# Patient Record
Sex: Male | Born: 1980 | Race: Black or African American | Hispanic: No | Marital: Married | Smoking: Never smoker
Health system: Southern US, Community
[De-identification: ages and names within clinical notes are randomized; demographics above are authoritative.]

## PROBLEM LIST (undated history)

## (undated) DIAGNOSIS — F419 Anxiety disorder, unspecified: Secondary | ICD-10-CM

## (undated) DIAGNOSIS — K219 Gastro-esophageal reflux disease without esophagitis: Secondary | ICD-10-CM

## (undated) HISTORY — PX: HERNIA REPAIR: SHX51

---

## 2001-11-06 ENCOUNTER — Emergency Department (HOSPITAL_COMMUNITY): Admission: EM | Admit: 2001-11-06 | Discharge: 2001-11-06 | Payer: Self-pay

## 2004-07-27 ENCOUNTER — Emergency Department (HOSPITAL_COMMUNITY): Admission: EM | Admit: 2004-07-27 | Discharge: 2004-07-27 | Payer: Self-pay | Admitting: Emergency Medicine

## 2006-11-13 ENCOUNTER — Emergency Department (HOSPITAL_COMMUNITY): Admission: EM | Admit: 2006-11-13 | Discharge: 2006-11-13 | Payer: Self-pay | Admitting: Emergency Medicine

## 2006-11-27 ENCOUNTER — Emergency Department (HOSPITAL_COMMUNITY): Admission: EM | Admit: 2006-11-27 | Discharge: 2006-11-27 | Payer: Self-pay | Admitting: Emergency Medicine

## 2007-07-17 ENCOUNTER — Emergency Department (HOSPITAL_COMMUNITY): Admission: EM | Admit: 2007-07-17 | Discharge: 2007-07-17 | Payer: Self-pay | Admitting: Emergency Medicine

## 2007-10-31 ENCOUNTER — Emergency Department (HOSPITAL_COMMUNITY): Admission: EM | Admit: 2007-10-31 | Discharge: 2007-10-31 | Payer: Self-pay | Admitting: Emergency Medicine

## 2007-11-12 ENCOUNTER — Emergency Department (HOSPITAL_COMMUNITY): Admission: EM | Admit: 2007-11-12 | Discharge: 2007-11-12 | Payer: Self-pay | Admitting: *Deleted

## 2008-01-04 ENCOUNTER — Emergency Department (HOSPITAL_COMMUNITY): Admission: EM | Admit: 2008-01-04 | Discharge: 2008-01-04 | Payer: Self-pay | Admitting: Emergency Medicine

## 2009-02-05 ENCOUNTER — Emergency Department (HOSPITAL_COMMUNITY): Admission: EM | Admit: 2009-02-05 | Discharge: 2009-02-05 | Payer: Self-pay | Admitting: Emergency Medicine

## 2011-05-12 ENCOUNTER — Emergency Department (HOSPITAL_COMMUNITY)
Admission: EM | Admit: 2011-05-12 | Discharge: 2011-05-12 | Disposition: A | Discharge status: Home / Self Care | Payer: BC Managed Care – PPO | Attending: Emergency Medicine | Admitting: Emergency Medicine

## 2011-05-12 DIAGNOSIS — B029 Zoster without complications: Secondary | ICD-10-CM | POA: Insufficient documentation

## 2011-07-15 LAB — RAPID STREP SCREEN (MED CTR MEBANE ONLY): Streptococcus, Group A Screen (Direct): NEGATIVE

## 2011-07-19 LAB — RAPID STREP SCREEN (MED CTR MEBANE ONLY): Streptococcus, Group A Screen (Direct): NEGATIVE

## 2012-03-02 ENCOUNTER — Encounter (HOSPITAL_COMMUNITY): Payer: Self-pay

## 2012-03-02 ENCOUNTER — Emergency Department (HOSPITAL_COMMUNITY)
Admission: EM | Admit: 2012-03-02 | Discharge: 2012-03-02 | Disposition: A | Discharge status: Home / Self Care | Payer: BC Managed Care – PPO | Attending: Emergency Medicine | Admitting: Emergency Medicine

## 2012-03-02 DIAGNOSIS — R197 Diarrhea, unspecified: Secondary | ICD-10-CM | POA: Insufficient documentation

## 2012-03-02 DIAGNOSIS — K5289 Other specified noninfective gastroenteritis and colitis: Secondary | ICD-10-CM | POA: Insufficient documentation

## 2012-03-02 DIAGNOSIS — E860 Dehydration: Secondary | ICD-10-CM

## 2012-03-02 DIAGNOSIS — K529 Noninfective gastroenteritis and colitis, unspecified: Secondary | ICD-10-CM

## 2012-03-02 LAB — POCT I-STAT, CHEM 8
BUN: 14 mg/dL (ref 6–23)
Glucose, Bld: 84 mg/dL (ref 70–99)
TCO2: 28 mmol/L (ref 0–100)

## 2012-03-02 MED ORDER — ONDANSETRON HCL 4 MG PO TABS: 4.0000 mg | ORAL_TABLET | Freq: Four times a day (QID) | ORAL | Status: AC

## 2012-03-02 MED ORDER — SODIUM CHLORIDE 0.9 % IV BOLUS (SEPSIS)
1000.0000 mL | Freq: Once | INTRAVENOUS | Status: AC
  Administered 2012-03-02: 1000 mL via INTRAVENOUS

## 2012-03-02 MED ORDER — ONDANSETRON HCL 4 MG/2ML IJ SOLN
4.0000 mg | Freq: Once | INTRAMUSCULAR | Status: AC
  Administered 2012-03-02: 4 mg via INTRAVENOUS
  Filled 2012-03-02: qty 2

## 2012-03-02 NOTE — ED Provider Notes (Addendum)
History   This chart was scribed for Forbes Cellar, MD by Melba Coon. The patient was seen in room CDU04/CDU04 and the patient's care was started at 11:31AM.    CSN: [id number]  Arrival date & time 03/02/12  1111   First MD Initiated Contact with Patient 03/02/12 1122      Chief Complaint  Patient presents with  . Diarrhea  . Emesis    (Consider location/radiation/quality/duration/timing/severity/associated sxs/prior treatment) HPI Evan Ibarra is a 31 y.o. male who presents to the Emergency Department complaining of constant, moderate to severe lightheadedness with an onset 2 days ago. Pt also c/o persistent emesis and diarrhea and "has felt funny" since onset. States he had several episodes of NB diarrhea yesterday which is improving today. Feels dehydrated. Pt's daughter was sick this past weekend with similar symptoms of vomiting and diarrhea.. Lightheadedness present; no LOC. Laying back alleviates the symptoms; sitting/standing up aggravates. Chills present 2 days, relieved after ibuprofen. Denies hematuria/dysuria/freq/urgency. No HA, fever, neck pain, sore throat, rash, back pain, CP, SOB, abd pain, dysuria, or extremity pain, edema, weakness, numbness, or tingling. No known allergies. No other pertinent medical symptoms. Unable to tolerate PO   History reviewed. No pertinent past medical history.  Past Surgical History  Procedure Date  . Hernia repair     History reviewed. No pertinent family history.  History  Substance Use Topics  . Smoking status: Never Smoker   . Smokeless tobacco: Not on file  . Alcohol Use: Yes     socially      Review of Systems 10 Systems reviewed and all are negative for acute change except as noted in the HPI.   Allergies  Review of patient's allergies indicates no known allergies.  Home Medications   Current Outpatient Rx  Name Route Sig Dispense Refill  . NAPROXEN SODIUM 220 MG PO TABS Oral Take 440 mg by mouth 2 (two)  times daily as needed. For pain      BP 117/84  Pulse 73  Temp(Src) 97.4 F (36.3 C) (Oral)  Resp 14  SpO2 100%  Physical Exam  Nursing note and vitals reviewed. Constitutional: He is oriented to person, place, and time. He appears well-developed and well-nourished. No distress.  HENT:  Head: Normocephalic and atraumatic.  Mouth/Throat: Mucous membranes are dry. No posterior oropharyngeal edema or posterior oropharyngeal erythema.  Eyes: EOM are normal. Pupils are equal, round, and reactive to light.  Neck: Normal range of motion. Neck supple. No tracheal deviation present.  Cardiovascular: Normal rate, regular rhythm and normal heart sounds.   No murmur heard. Pulmonary/Chest: Effort normal and breath sounds normal. No respiratory distress. He has no wheezes. He has no rales.  Abdominal: Soft. There is no tenderness.       abd benign  Musculoskeletal: Normal range of motion. He exhibits no edema and no tenderness.  Neurological: He is alert and oriented to person, place, and time.  Skin: Skin is warm and dry.  Psychiatric: He has a normal mood and affect. His behavior is normal.    ED Course  Procedures (including critical care time)  COORDINATION OF CARE:  11:35PM - EDMD will order IV fluids and Zofran for the pt.    Labs Reviewed  POCT I-STAT, CHEM 8   No results found.   1. Gastroenteritis   2. Dehydration      MDM  Likely viral syndrome with mild dehydration. Istat chem 8, NS bolus x 2, zofran. Anticipate discharge after hydration. Move to  CDU- will discharge when tolerating PO  I personally performed the services described in this documentation, which was scribed in my presence. The recorded information has been reviewed and considered.   Tolerating PO per nursing staff. Home with zofran odt. No EMC precluding discharge at this time. Given Precautions for return. PMD f/u.        Forbes Cellar, MD 03/02/12 1217  Forbes Cellar, MD 03/02/12  1228  Forbes Cellar, MD 03/02/12 [phone]

## 2012-03-02 NOTE — ED Notes (Signed)
Per EDP, plan to give pt PO challenge, if pt tolerates, will go home. Pt currently drinking ginger ale and eating [place] sandwich

## 2012-03-02 NOTE — Discharge Instructions (Signed)
Dehydration, Adult Dehydration is when you lose more fluids from the body than you take in. Vital organs like the kidneys, brain, and heart cannot function without a proper amount of fluids and salt. Any loss of fluids from the body can cause dehydration.  CAUSES   Vomiting.   Diarrhea.   Excessive sweating.   Excessive urine output.   Fever.  SYMPTOMS  Mild dehydration  Thirst.   Dry lips.   Slightly dry mouth.  Moderate dehydration  Very dry mouth.   Sunken eyes.   Skin does not bounce back quickly when lightly pinched and released.   Dark urine and decreased urine production.   Decreased tear production.   Headache.  Severe dehydration  Very dry mouth.   Extreme thirst.   Rapid, weak pulse (more than 100 beats per minute at rest).   Cold hands and feet.   Not able to sweat in spite of heat and temperature.   Rapid breathing.   Blue lips.   Confusion and lethargy.   Difficulty being awakened.   Minimal urine production.   No tears.  DIAGNOSIS  Your caregiver will diagnose dehydration based on your symptoms and your exam. Blood and urine tests will help confirm the diagnosis. The diagnostic evaluation should also identify the cause of dehydration. TREATMENT  Treatment of mild or moderate dehydration can often be done at home by increasing the amount of fluids that you drink. It is best to drink small amounts of fluid more often. Drinking too much at one time can make vomiting worse. Refer to the home care instructions below. Severe dehydration needs to be treated at the hospital where you will probably be given intravenous (IV) fluids that contain water and electrolytes. HOME CARE INSTRUCTIONS   Ask your caregiver about specific rehydration instructions.   Drink enough fluids to keep your urine clear or pale yellow.   Drink small amounts frequently if you have nausea and vomiting.   Eat as you normally do.   Avoid:   Foods or drinks high in  sugar.   Carbonated drinks.   Juice.   Extremely hot or cold fluids.   Drinks with caffeine.   Fatty, greasy foods.   Alcohol.   Tobacco.   Overeating.   Gelatin desserts.   Wash your hands well to avoid spreading bacteria and viruses.   Only take over-the-counter or prescription medicines for pain, discomfort, or fever as directed by your caregiver.   Ask your caregiver if you should continue all prescribed and over-the-counter medicines.   Keep all follow-up appointments with your caregiver.  SEEK MEDICAL CARE IF:  You have abdominal pain and it increases or stays in one area (localizes).   You have a rash, stiff neck, or severe headache.   You are irritable, sleepy, or difficult to awaken.   You are weak, dizzy, or extremely thirsty.  SEEK IMMEDIATE MEDICAL CARE IF:   You are unable to keep fluids down or you get worse despite treatment.   You have frequent episodes of vomiting or diarrhea.   You have blood or green matter (bile) in your vomit.   You have blood in your stool or your stool looks black and tarry.   You have not urinated in 6 to 8 hours, or you have only urinated a small amount of very dark urine.   You have a fever.   You faint.  MAKE SURE YOU:   Understand these instructions.   Will watch your condition.     Will get help right away if you are not doing well or get worse.  Document Released: 10/11/2005 Document Revised: 09/30/2011 Document Reviewed: 05/31/2011 Exit[employer] Patient Information 2012 [employer]  Viral Gastroenteritis Viral gastroenteritis is also known as stomach flu. This condition affects the stomach and intestinal tract. It can cause sudden diarrhea and vomiting. The illness typically lasts 3 to 8 days. Most people develop an immune response that eventually gets rid of the virus. While this natural response develops, the virus can make you quite ill. CAUSES  Many different viruses can cause gastroenteritis, such as  rotavirus or noroviruses. You can catch one of these viruses by consuming contaminated food or water. You may also catch a virus by sharing utensils or other personal items with an infected person or by touching a contaminated surface. SYMPTOMS  The most common symptoms are diarrhea and vomiting. These problems can cause a severe loss of body fluids (dehydration) and a body salt (electrolyte) imbalance. Other symptoms may include:  Fever.   Headache.   Fatigue.   Abdominal pain.  DIAGNOSIS  Your caregiver can usually diagnose viral gastroenteritis based on your symptoms and a physical exam. A stool sample may also be taken to test for the presence of viruses or other infections. TREATMENT  This illness typically goes away on its own. Treatments are aimed at rehydration. The most serious cases of viral gastroenteritis involve vomiting so severely that you are not able to keep fluids down. In these cases, fluids must be given through an intravenous line (IV). HOME CARE INSTRUCTIONS   Drink enough fluids to keep your urine clear or pale yellow. Drink small amounts of fluids frequently and increase the amounts as tolerated.   Ask your caregiver for specific rehydration instructions.   Avoid:   Foods high in sugar.   Alcohol.   Carbonated drinks.   Tobacco.   Juice.   Caffeine drinks.   Extremely hot or cold fluids.   Fatty, greasy foods.   Too much intake of anything at one time.   Dairy products until 24 to 48 hours after diarrhea stops.   You may consume probiotics. Probiotics are active cultures of beneficial bacteria. They may lessen the amount and number of diarrheal stools in adults. Probiotics can be found in yogurt with active cultures and in supplements.   Wash your hands well to avoid spreading the virus.   Only take over-the-counter or prescription medicines for pain, discomfort, or fever as directed by your caregiver. Do not give aspirin to children.  Antidiarrheal medicines are not recommended.   Ask your caregiver if you should continue to take your regular prescribed and over-the-counter medicines.   Keep all follow-up appointments as directed by your caregiver.  SEEK IMMEDIATE MEDICAL CARE IF:   You are unable to keep fluids down.   You do not urinate at least once every 6 to 8 hours.   You develop shortness of breath.   You notice blood in your stool or vomit. This may look like coffee grounds.   You have abdominal pain that increases or is concentrated in one small area (localized).   You have persistent vomiting or diarrhea.   You have a fever.   The patient is a child younger than 3 months, and he or she has a fever.   The patient is a child older than 3 months, and he or she has a fever and persistent symptoms.   The patient is a child older than 3 months, and   he or she has a fever and symptoms suddenly get worse.   The patient is a baby, and he or she has no tears when crying.  MAKE SURE YOU:   Understand these instructions.   Will watch your condition.   Will get help right away if you are not doing well or get worse.  Document Released: 10/11/2005 Document Revised: 09/30/2011 Document Reviewed: 07/28/2011 Exit[employer] Patient Information 2012 [employer]. 

## 2012-03-02 NOTE — ED Notes (Signed)
Pt VSS. Able to tolerate po well in ED. Will D/c patient.

## 2012-03-02 NOTE — ED Notes (Signed)
Patient states his child had nausea and vomiting recent and patient past two days nausea vomiting and watery stool.  Denies abdominal pain airway intact bilateral equal chest rise and fall.  States intermittent headache improves with antiinflammatory.  Pain currently 2/10 throbbing.  Resting comfortably on stretcher.

## 2012-03-02 NOTE — ED Notes (Signed)
Pt complains of vomiting and headache, since Tuesday after lunch, pt sts that daughter has had a virus.

## 2016-08-30 ENCOUNTER — Emergency Department (HOSPITAL_COMMUNITY)
Admission: EM | Admit: 2016-08-30 | Discharge: 2016-08-30 | Disposition: A | Discharge status: Home / Self Care | Payer: BLUE CROSS/BLUE SHIELD | Attending: Emergency Medicine | Admitting: Emergency Medicine

## 2016-08-30 ENCOUNTER — Encounter (HOSPITAL_COMMUNITY): Payer: Self-pay | Admitting: Emergency Medicine

## 2016-08-30 ENCOUNTER — Emergency Department (HOSPITAL_COMMUNITY): Payer: BLUE CROSS/BLUE SHIELD

## 2016-08-30 DIAGNOSIS — R0789 Other chest pain: Secondary | ICD-10-CM | POA: Diagnosis not present

## 2016-08-30 DIAGNOSIS — R079 Chest pain, unspecified: Secondary | ICD-10-CM | POA: Diagnosis present

## 2016-08-30 LAB — TROPONIN I: Troponin I: <0.03 ng/mL (ref <0.03)

## 2016-08-30 LAB — LIPID PANEL
Cholesterol: 154 mg/dL (ref 0–200)
HDL: 53 mg/dL (ref >40)
LDL CALC: 88 mg/dL (ref 0–99)
Total CHOL/HDL Ratio: 2.9 RATIO
Triglycerides: 64 mg/dL (ref <150)
VLDL: 13 mg/dL (ref 0–40)

## 2016-08-30 LAB — COMPREHENSIVE METABOLIC PANEL
ALBUMIN: 4.5 g/dL (ref 3.5–5.0)
ALK PHOS: 44 U/L (ref 38–126)
ALT: 10 U/L — ABNORMAL LOW (ref 17–63)
AST: 18 U/L (ref 15–41)
Anion gap: 7 (ref 5–15)
BILIRUBIN TOTAL: 0.5 mg/dL (ref 0.3–1.2)
BUN: 13 mg/dL (ref 6–20)
CALCIUM: 9.1 mg/dL (ref 8.9–10.3)
CO2: 29 mmol/L (ref 22–32)
Chloride: 103 mmol/L (ref 101–111)
Creatinine, Ser: 1 mg/dL (ref 0.61–1.24)
GFR calc Af Amer: >60 mL/min (ref >60)
GFR calc non Af Amer: >60 mL/min (ref >60)
GLUCOSE: 123 mg/dL — AB (ref 65–99)
POTASSIUM: 3.6 mmol/L (ref 3.5–5.1)
Sodium: 139 mmol/L (ref 135–145)
TOTAL PROTEIN: 7.4 g/dL (ref 6.5–8.1)

## 2016-08-30 LAB — CBC
HCT: 43.8 % (ref 39.0–52.0)
HEMOGLOBIN: 15 g/dL (ref 13.0–17.0)
MCH: 32 pg (ref 26.0–34.0)
MCHC: 34.2 g/dL (ref 30.0–36.0)
MCV: 93.4 fL (ref 78.0–100.0)
Platelets: 283 10*3/uL (ref 150–400)
RBC: 4.69 MIL/uL (ref 4.22–5.81)
RDW: 13.2 % (ref 11.5–15.5)
WBC: 9 10*3/uL (ref 4.0–10.5)

## 2016-08-30 LAB — DIFFERENTIAL
Basophils Absolute: 0 10*3/uL (ref 0.0–0.1)
Basophils Relative: 0 %
EOS PCT: 4 %
Eosinophils Absolute: 0.3 10*3/uL (ref 0.0–0.7)
LYMPHS ABS: 3.7 10*3/uL (ref 0.7–4.0)
LYMPHS PCT: 41 %
MONO ABS: 0.4 10*3/uL (ref 0.1–1.0)
MONOS PCT: 5 %
Neutro Abs: 4.5 10*3/uL (ref 1.7–7.7)
Neutrophils Relative %: 50 %

## 2016-08-30 LAB — PROTIME-INR
INR: 1.06
Prothrombin Time: 13.9 seconds (ref 11.4–15.2)

## 2016-08-30 LAB — APTT: aPTT: 32 seconds (ref 24–36)

## 2016-08-30 MED ORDER — GI COCKTAIL ~~LOC~~
30.0000 mL | Freq: Once | ORAL | Status: AC
  Administered 2016-08-30: 30 mL via ORAL
  Filled 2016-08-30: qty 30

## 2016-08-30 MED ORDER — HEPARIN SODIUM (PORCINE) 5000 UNIT/ML IJ SOLN: 60.0000 [IU]/kg | Freq: Once | INTRAMUSCULAR | Status: DC

## 2016-08-30 MED ORDER — ASPIRIN 81 MG PO CHEW
324.0000 mg | CHEWABLE_TABLET | Freq: Once | ORAL | Status: AC
  Administered 2016-08-30: 324 mg via ORAL
  Filled 2016-08-30: qty 4

## 2016-08-30 NOTE — ED Triage Notes (Signed)
Chest pain on left side. Started couple weeks ago. Took tums thinking it was indigestion. Non radiating. Coughing makes it more painful. Had cold recently. Denies nausea, diaphoresis. Pain comes and goes and is sharp.

## 2016-08-30 NOTE — ED Notes (Signed)
Pt moved to hallway per charge RN

## 2016-08-30 NOTE — ED Provider Notes (Signed)
Initial ECG reviewed w cardiology.  No Code STEMI indicated   Evan Munchobert Haadi Santellan, MD 08/30/16 1322

## 2016-08-30 NOTE — ED Notes (Signed)
ED Provider at bedside. 

## 2016-08-30 NOTE — ED Provider Notes (Signed)
[facility]-EMERGENCY DEPT Provider Note   CSN: [id number] Arrival date & time: 08/30/16  1303     History   Chief Complaint Chief Complaint  Patient presents with  . Chest Pain    HPI Evan Ibarra is a [age] y.o. male.  Patient presents with sharp lateral chest pain worsened today after eating and notes 2-3 weeks of similar pains that worsen with eating certain foods and improve with antacids. No associated nausea, vomiting, diaphoresis. No dyspnea. No radiation to arm or jaw. No known medical problems. No family history of early cardiac disease.   The history is provided by the patient. No language interpreter was used.  Chest Pain   This is a recurrent problem. The current episode started 1 to 2 hours ago. The problem occurs constantly. The problem has been gradually worsening. The pain is associated with eating. The pain is present in the lateral region. The pain is moderate. The quality of the pain is described as sharp. The pain does not radiate. Exacerbated by: eating. Pertinent negatives include no abdominal pain, no cough, no fever, no hemoptysis, no nausea, no shortness of breath and no vomiting. He has tried antacids for the symptoms. The treatment provided moderate relief. Risk factors include male gender.  Pertinent negatives for past medical history include no CAD, no COPD, no CHF, no diabetes, no hyperlipidemia, no hypertension, no MI and no PE.  His family medical history is significant for diabetes and hypertension.  Pertinent negatives for family medical history include: no heart disease.  Procedure history is negative for cardiac catheterization, stress echo and exercise treadmill test.    History reviewed. No pertinent past medical history.  There are no active problems to display for this patient.   Past Surgical History:  Procedure Laterality Date  . HERNIA REPAIR         Home Medications    Prior to Admission medications   Medication Sig Start Date End Date  Taking? Authorizing Provider  calcium carbonate (TUMS - DOSED IN MG ELEMENTAL CALCIUM) 500 MG chewable tablet Chew 1 tablet by mouth daily as needed for indigestion or heartburn.   Yes Historical Provider, MD  naproxen sodium (ANAPROX) 220 MG tablet Take 440 mg by mouth 2 (two) times daily as needed (for pain).    Yes Historical Provider, MD    Family History History reviewed. No pertinent family history.  Social History Social History  Substance Use Topics  . Smoking status: Never Smoker  . Smokeless tobacco: Not on file  . Alcohol use Yes     Comment: socially     Allergies   Patient has no known allergies.   Review of Systems Review of Systems  Constitutional: Negative for fever.  HENT: Negative.   Respiratory: Negative for cough, hemoptysis and shortness of breath.   Cardiovascular: Positive for chest pain.  Gastrointestinal: Negative for abdominal pain, nausea and vomiting.  Genitourinary: Negative.   Musculoskeletal: Negative.   Skin: Negative.   Allergic/Immunologic: Negative for immunocompromised state.  Neurological: Negative.   Hematological: Does not bruise/bleed easily.  Psychiatric/Behavioral: Negative.      Physical Exam Updated Vital Signs BP 120/81   Pulse 75   Temp 98.1 F (36.7 C) (Oral)   Resp 18   Ht 6\' 3"  (1.905 m)   Wt 108.4 kg   SpO2 99%   BMI 29.87 kg/m   Physical Exam  Constitutional: He is oriented to person, place, and time. He appears well-developed and well-nourished. No distress.  HENT:  Head: Normocephalic and atraumatic.  Eyes: Conjunctivae and EOM are normal. No scleral icterus.  Neck: Normal range of motion. Neck supple.  Cardiovascular: Normal rate, regular rhythm, normal heart sounds and intact distal pulses.  Exam reveals no gallop and no friction rub.   No murmur heard. Pulmonary/Chest: Effort normal and breath sounds normal. No respiratory distress. He has no wheezes. He has no rales.  Abdominal: Soft. He exhibits no  distension. There is no tenderness. There is no rebound and no guarding.  Neurological: He is alert and oriented to person, place, and time.  Skin: Skin is warm and dry. He is not diaphoretic.  Psychiatric: He has a normal mood and affect. His behavior is normal. Judgment and thought content normal.     ED Treatments / Results  Labs (all labs ordered are listed, but only abnormal results are displayed) Labs Reviewed  COMPREHENSIVE METABOLIC PANEL - Abnormal; Notable for the following:       Result Value   Glucose, Bld 123 (*)    ALT 10 (*)    All other components within normal limits  CBC  DIFFERENTIAL  PROTIME-INR  APTT  TROPONIN I  LIPID PANEL  TROPONIN I    EKG  EKG Interpretation  Date/Time:  Monday August 30 2016 13:09:54 EST Ventricular Rate:  72 PR Interval:  216 QRS Duration: 104 QT Interval:  396 QTC Calculation: 433 R Axis:   79 Text Interpretation:  Sinus rhythm with 1st degree A-V block ST-t wave abnormality Abnormal ekg Confirmed by Gerhard MunchLOCKWOOD, ROBERT  MD [phone]([id number]) on 08/30/2016 1:14:32 PM       Radiology No results found.  Procedures Procedures (including critical care time)  Medications Ordered in ED Medications  aspirin chewable tablet 324 mg (324 mg Oral Given 08/30/16 1343)  gi cocktail (Maalox,Lidocaine,Donnatal) (30 mLs Oral Given 08/30/16 1343)     Initial Impression / Assessment and Plan / ED Course  I have reviewed the triage vital signs and the nursing notes.  Pertinent labs & imaging results that were available during my care of the patient were reviewed by me and considered in my medical decision making (see chart for details).  Clinical Course     Patient presents with intermittent chest pain for the last two weeks associated with eating. Came in today because it was worse than it typical. On arrival he had an EKG that initially revealed concerning ST changes with no prior for comparison. No obvious STEMI. Cardiology was called to  discuss EKG who were not concerned and did not feel EKG was consistent with ischemic changes. He is well-appearing on arrival with a normal exam. Initial troponin is negative. Symptoms improved with GI cocktail. [facility] He is PERC and Well's negative with no indication for further workup of VTE. Will obtain a delta troponin at 3 hours along with second EKG. If negative, patient can be discharged with a prescription for a PPI. Care of patient transferred to Dr. Ladona RidgelGaddy at 1500 with a plan to follow up the second troponin and CXR, disposition pending results.  Final Clinical Impressions(s) / ED Diagnoses   Final diagnoses:  Atypical chest pain    New Prescriptions Current Discharge Medication List       Preston FleetingAnna Lagena Strand, MD 08/30/16 1547    Gerhard Munchobert Lockwood, MD 08/31/16 [phone]

## 2016-09-12 ENCOUNTER — Emergency Department (HOSPITAL_COMMUNITY)
Admission: EM | Admit: 2016-09-12 | Discharge: 2016-09-12 | Disposition: A | Discharge status: Home / Self Care | Payer: BLUE CROSS/BLUE SHIELD | Attending: Emergency Medicine | Admitting: Emergency Medicine

## 2016-09-12 ENCOUNTER — Encounter (HOSPITAL_COMMUNITY): Payer: Self-pay

## 2016-09-12 DIAGNOSIS — Z5321 Procedure and treatment not carried out due to patient leaving prior to being seen by health care provider: Secondary | ICD-10-CM | POA: Diagnosis not present

## 2016-09-12 DIAGNOSIS — R079 Chest pain, unspecified: Secondary | ICD-10-CM | POA: Insufficient documentation

## 2016-09-12 HISTORY — DX: Gastro-esophageal reflux disease without esophagitis: K21.9

## 2016-09-12 LAB — CBC
HCT: 44.5 % (ref 39.0–52.0)
Hemoglobin: 15.1 g/dL (ref 13.0–17.0)
MCH: 31.4 pg (ref 26.0–34.0)
MCHC: 33.9 g/dL (ref 30.0–36.0)
MCV: 92.5 fL (ref 78.0–100.0)
PLATELETS: 346 10*3/uL (ref 150–400)
RBC: 4.81 MIL/uL (ref 4.22–5.81)
RDW: 13 % (ref 11.5–15.5)
WBC: 12 10*3/uL — ABNORMAL HIGH (ref 4.0–10.5)

## 2016-09-12 LAB — BASIC METABOLIC PANEL
Anion gap: 9 (ref 5–15)
BUN: 8 mg/dL (ref 6–20)
CALCIUM: 9.3 mg/dL (ref 8.9–10.3)
CHLORIDE: 109 mmol/L (ref 101–111)
CO2: 24 mmol/L (ref 22–32)
CREATININE: 0.92 mg/dL (ref 0.61–1.24)
GFR calc non Af Amer: >60 mL/min (ref >60)
GLUCOSE: 102 mg/dL — AB (ref 65–99)
Potassium: 3.8 mmol/L (ref 3.5–5.1)
Sodium: 142 mmol/L (ref 135–145)

## 2016-09-12 LAB — I-STAT TROPONIN, ED: TROPONIN I, POC: 0.01 ng/mL (ref 0.00–0.08)

## 2016-09-12 NOTE — ED Notes (Signed)
Went to lobby called pt again no response pt taken out as LWBS

## 2016-09-12 NOTE — ED Notes (Signed)
Went to lobby called pt. No response x2

## 2016-09-12 NOTE — ED Triage Notes (Signed)
Patient states that he developed left axilla/chest pain this am while at work. States that he forgot to take his zantac before work today. Seen on 11/6 for same and has felt fine while taking zantac daily until missed dose today, NAD

## 2017-01-19 ENCOUNTER — Emergency Department (HOSPITAL_COMMUNITY)
Admission: EM | Admit: 2017-01-19 | Discharge: 2017-01-19 | Disposition: A | Discharge status: Home / Self Care | Payer: BLUE CROSS/BLUE SHIELD | Attending: Emergency Medicine | Admitting: Emergency Medicine

## 2017-01-19 ENCOUNTER — Encounter (HOSPITAL_COMMUNITY): Payer: Self-pay | Admitting: Emergency Medicine

## 2017-01-19 DIAGNOSIS — H9201 Otalgia, right ear: Secondary | ICD-10-CM | POA: Diagnosis present

## 2017-01-19 DIAGNOSIS — H60391 Other infective otitis externa, right ear: Secondary | ICD-10-CM | POA: Diagnosis not present

## 2017-01-19 MED ORDER — OXYCODONE-ACETAMINOPHEN 5-325 MG PO TABS
1.0000 | ORAL_TABLET | ORAL | Status: DC | PRN
  Administered 2017-01-19: 1 via ORAL

## 2017-01-19 MED ORDER — AMOXICILLIN 500 MG PO CAPS: 500.0000 mg | ORAL_CAPSULE | Freq: Three times a day (TID) | ORAL | 0 refills | Status: DC

## 2017-01-19 MED ORDER — OXYCODONE-ACETAMINOPHEN 5-325 MG PO TABS
1.0000 | ORAL_TABLET | Freq: Once | ORAL | Status: AC
  Administered 2017-01-19: 1 via ORAL
  Filled 2017-01-19: qty 1

## 2017-01-19 MED ORDER — OXYCODONE-ACETAMINOPHEN 5-325 MG PO TABS
ORAL_TABLET | ORAL | Status: AC
  Filled 2017-01-19: qty 1

## 2017-01-19 NOTE — ED Triage Notes (Signed)
Pt presents with worsening RIGHT ear pain; pt [facility] for same and dx with ruptured ear drum and given rx for antibiotics; pt states pain now excruciating

## 2017-01-19 NOTE — Discharge Instructions (Signed)
Continue the antibiotic drops as prescribed. Start taking the antibiotic by mouth as well. Follow-up with the ENT doctor. Return to the ED if you develop new or worsening symptoms.

## 2017-01-19 NOTE — ED Provider Notes (Signed)
[facility]EMERGENCY DEPT Provider Note   CSN: [id number] Arrival date & time: 01/19/17  0349     History   Chief Complaint Chief Complaint  Patient presents with  . Otalgia    RIGHT    HPI Evan Ibarra is a 36 y.o. male.  Patient presents with a one-week history of right ear pain. He was diagnosed with otitis externa and perforated eardrum at [facility] on March 23. He was given antibiotic drops which he has been using. Pain in his right ear became acutely worse tonight while he was watching TV. He denies any bleeding or swelling. Pain is now improved after getting Percocet in triage. Denies any fever, chills, nausea or vomiting no bleeding or drainage from the ear. No sore throat or cough. No chest pain or shortness of breath. He has not seen ENT.    The history is provided by the patient.  Otalgia  Associated symptoms include hearing loss. Pertinent negatives include no ear discharge, no abdominal pain, no vomiting and no cough.    Past Medical History:  Diagnosis Date  . GERD (gastroesophageal reflux disease)     There are no active problems to display for this patient.   Past Surgical History:  Procedure Laterality Date  . HERNIA REPAIR         Home Medications    Prior to Admission medications   Medication Sig Start Date End Date Taking? Authorizing Provider  neomycin-polymyxin-hydrocortisone (CORTISPORIN) 3.5-10000-1 otic suspension Place 4 drops into the right ear 3 (three) times daily. 01/14/17 01/21/17 Yes Historical Provider, MD  amoxicillin (AMOXIL) 500 MG capsule Take 1 capsule (500 mg total) by mouth 3 (three) times daily. 01/19/17   [name], MD    Family History No family history on file.  Social History Social History  Substance Use Topics  . Smoking status: Never Smoker  . Smokeless tobacco: Not on file  . Alcohol use Yes     Comment: socially     Allergies   Patient has no known allergies.   Review of Systems Review of Systems    Constitutional: Negative for activity change and appetite change.  HENT: Positive for ear pain and hearing loss. Negative for ear discharge.   Eyes: Negative for visual disturbance.  Respiratory: Negative for cough, shortness of breath and stridor.   Cardiovascular: Negative for chest pain.  Gastrointestinal: Negative for abdominal pain, nausea and vomiting.  Genitourinary: Negative for dysuria and hematuria.  Musculoskeletal: Negative for arthralgias and myalgias.  Neurological: Negative for dizziness, weakness, light-headedness and numbness.  A complete 10 system review of systems was obtained and all systems are negative except as noted in the HPI and PMH.     Physical Exam Updated Vital Signs BP (!) 129/94 (BP Location: Right Arm)   Pulse 68   Temp 98.1 F (36.7 C) (Oral)   Resp 16   Ht 6\' 4"  (1.93 m)   Wt 220 lb (99.8 kg)   SpO2 97%   BMI 26.78 kg/m   Physical Exam  Constitutional: He is oriented to person, place, and time. He appears well-developed and well-nourished. No distress.  HENT:  Head: Normocephalic and atraumatic.  Mouth/Throat: Oropharynx is clear and moist. No oropharyngeal exudate.  Right external ear is normal. No tragus or mastoid pain. There is debris in the right ear canal which is erythematous. Tympanic membrane appears intact and is mildly erythematous.  Eyes: Conjunctivae and EOM are normal. Pupils are equal, round, and reactive to light.  Neck:  Normal range of motion. Neck supple.  No meningismus.  Cardiovascular: Normal rate, regular rhythm, normal heart sounds and intact distal pulses.   No murmur heard. Pulmonary/Chest: Effort normal and breath sounds normal. No respiratory distress.  Abdominal: Soft. There is no tenderness. There is no rebound and no guarding.  Musculoskeletal: Normal range of motion. He exhibits no edema or tenderness.  Neurological: He is alert and oriented to person, place, and time. No cranial nerve deficit. He exhibits  normal muscle tone. Coordination normal.  No ataxia on finger to nose bilaterally. No pronator drift. 5/5 strength throughout. CN 2-12 intact.Equal grip strength. Sensation intact.   Skin: Skin is warm.  Psychiatric: He has a normal mood and affect. His behavior is normal.  Nursing note and vitals reviewed.    ED Treatments / Results  Labs (all labs ordered are listed, but only abnormal results are displayed) Labs Reviewed - No data to display  EKG  EKG Interpretation None       Radiology No results found.  Procedures Procedures (including critical care time)  Medications Ordered in ED Medications  oxyCODONE-acetaminophen (PERCOCET/ROXICET) 5-325 MG per tablet 1 tablet (1 tablet Oral Given 01/19/17 0400)  oxyCODONE-acetaminophen (PERCOCET/ROXICET) 5-325 MG per tablet (  Canceled Entry 01/19/17 0513)     Initial Impression / Assessment and Plan / ED Course  I have reviewed the triage vital signs and the nursing notes.  Pertinent labs & imaging results that were available during my care of the patient were reviewed by me and considered in my medical decision making (see chart for details).    Patient with otitis externa that appears to be healing slowly. We'll add by mouth antibiotics. Needs to follow-up with ENT as likely would benefit from suctioning of debris from ear canal.  Pain controlled in the ED. Follow-up with ENT. Return precautions discussed. No evidence of mastoiditis. Continue cortisporin.  Add PO antibiotics.   Final Clinical Impressions(s) / ED Diagnoses   Final diagnoses:  Otitis, externa, infective, right    New Prescriptions New Prescriptions   No medications on file     Glynn Octave, MD 01/19/17 [phone]

## 2017-01-19 NOTE — ED Notes (Signed)
Pt wishes to not put a gown on at this time.

## 2017-06-28 ENCOUNTER — Emergency Department (HOSPITAL_COMMUNITY): Payer: BLUE CROSS/BLUE SHIELD

## 2017-06-28 ENCOUNTER — Emergency Department (HOSPITAL_COMMUNITY)
Admission: EM | Admit: 2017-06-28 | Discharge: 2017-06-28 | Disposition: A | Discharge status: Home / Self Care | Payer: BLUE CROSS/BLUE SHIELD | Attending: Emergency Medicine | Admitting: Emergency Medicine

## 2017-06-28 ENCOUNTER — Encounter (HOSPITAL_COMMUNITY): Payer: Self-pay | Admitting: Emergency Medicine

## 2017-06-28 DIAGNOSIS — R0789 Other chest pain: Secondary | ICD-10-CM | POA: Insufficient documentation

## 2017-06-28 DIAGNOSIS — R0602 Shortness of breath: Secondary | ICD-10-CM | POA: Insufficient documentation

## 2017-06-28 LAB — CBC
HEMATOCRIT: 42 % (ref 39.0–52.0)
Hemoglobin: 13.9 g/dL (ref 13.0–17.0)
MCH: 31.2 pg (ref 26.0–34.0)
MCHC: 33.1 g/dL (ref 30.0–36.0)
MCV: 94.4 fL (ref 78.0–100.0)
Platelets: 289 10*3/uL (ref 150–400)
RBC: 4.45 MIL/uL (ref 4.22–5.81)
RDW: 13 % (ref 11.5–15.5)
WBC: 7.3 10*3/uL (ref 4.0–10.5)

## 2017-06-28 LAB — BASIC METABOLIC PANEL
Anion gap: 7 (ref 5–15)
BUN: 13 mg/dL (ref 6–20)
CO2: 25 mmol/L (ref 22–32)
Calcium: 9 mg/dL (ref 8.9–10.3)
Chloride: 109 mmol/L (ref 101–111)
Creatinine, Ser: 0.91 mg/dL (ref 0.61–1.24)
GFR calc Af Amer: >60 mL/min (ref >60)
GLUCOSE: 102 mg/dL — AB (ref 65–99)
POTASSIUM: 3.8 mmol/L (ref 3.5–5.1)
Sodium: 141 mmol/L (ref 135–145)

## 2017-06-28 LAB — I-STAT TROPONIN, ED: Troponin i, poc: 0.01 ng/mL (ref 0.00–0.08)

## 2017-06-28 MED ORDER — ESOMEPRAZOLE MAGNESIUM 40 MG PO CPDR: 40.0000 mg | DELAYED_RELEASE_CAPSULE | Freq: Every day | ORAL | 0 refills | Status: DC

## 2017-06-28 NOTE — Discharge Instructions (Signed)
Take nexium once a day.  Stay well hydrated Avoid fatty/greasy, acidic, or spicy foods. Eat multiple small foods throughout the day. Do not lie down for 60 minutes after you eat, as this can worsen your symptoms.  Follow up with your primary care doctor for further evaluation of your symptoms.  Return to the ER if you develop fevers, chill, persistent chest pain, shortness of breath, or any new or worsening symptoms.

## 2017-06-28 NOTE — ED Provider Notes (Signed)
[facility] Provider Note   CSN: [id number] Arrival date & time: 06/28/17  1324     History   Chief Complaint Chief Complaint  Patient presents with  . Shortness of Breath  . Chest Pain    HPI Evan Ibarra is a 36 y.o. male presenting with intermittent chest pain.  Patient states that since Friday, he's had intermittent L sided chest pain. When he has it, and lasts for a few seconds before it goes away. It resolves spontaneously. Chest pain is brought on by eating greasy foods. Patient states he's had similar symptoms before in the past, several months ago, in which he was diagnosed with indigestion. He was prescribed medicine for heartburn at that time, which relieved his symptoms. He has run out of this medicine, so he has not been taking it recently. When he has chest pain, he denies associated vision changes, shortness of breath, nausea, vomiting, or diaphoresis. He has no history of cardiac problems. He does not have a history of hypertension/DM/hyperlipidemia, he does not smoke, and he does not have a family history of heart attack. He does not have any leg pain or swelling. Pain is not brought on by exertion. Patient has no other medical problems, does not take any medicines on a daily basis.  Patient does not currently have any pain.  HPI  Past Medical History:  Diagnosis Date  . GERD (gastroesophageal reflux disease)     There are no active problems to display for this patient.   Past Surgical History:  Procedure Laterality Date  . HERNIA REPAIR         Home Medications    Prior to Admission medications   Medication Sig Start Date End Date Taking? Authorizing Provider  amoxicillin (AMOXIL) 500 MG capsule Take 1 capsule (500 mg total) by mouth 3 (three) times daily. 01/19/17   Rancour, Jeannett Senior, MD  esomeprazole (NEXIUM) 40 MG capsule Take 1 capsule (40 mg total) by mouth daily. 06/28/17   Jaeanna Mccomber, PA-C    Family History History reviewed. No  pertinent family history.  Social History Social History  Substance Use Topics  . Smoking status: Never Smoker  . Smokeless tobacco: Never Used  . Alcohol use Yes     Comment: socially     Allergies   Patient has no known allergies.   Review of Systems Review of Systems  Constitutional: Negative for chills and fever.  HENT: Negative for congestion and sore throat.   Eyes: Negative for visual disturbance.  Respiratory: Negative for cough, chest tightness and shortness of breath.   Cardiovascular: Positive for chest pain. Negative for palpitations and leg swelling.  Gastrointestinal: Negative for abdominal distention, abdominal pain, constipation, diarrhea, nausea and vomiting.  Genitourinary: Negative for dysuria, frequency and hematuria.  Musculoskeletal: Negative for back pain and neck pain.  Skin: Negative for wound.  Allergic/Immunologic: Negative for immunocompromised state.  Neurological: Negative for dizziness, light-headedness and headaches.  Hematological: Does not bruise/bleed easily.     Physical Exam Updated Vital Signs BP (!) 139/91 (BP Location: Right Arm)   Pulse 72   Temp 98.8 F (37.1 C) (Oral)   Resp 16   Ht 6\' 3"  (1.905 m)   Wt 108.9 kg (240 lb)   SpO2 99%   BMI 30.00 kg/m   Physical Exam  Constitutional: He is oriented to person, place, and time. He appears well-developed and well-nourished. No distress.  HENT:  Head: Normocephalic and atraumatic.  Eyes: Pupils are equal, round, and reactive  to light. EOM are normal.  Neck: Normal range of motion. Neck supple.  Cardiovascular: Normal rate, regular rhythm and intact distal pulses.   Pulmonary/Chest: Effort normal and breath sounds normal. No respiratory distress. He has no decreased breath sounds. He has no wheezes. He has no rhonchi. He has no rales. He exhibits no tenderness.  Abdominal: Soft. Bowel sounds are normal. He exhibits no distension and no mass. There is no tenderness. There is no  rigidity, no rebound, no guarding, no CVA tenderness, no tenderness at McBurney's point and negative Murphy's sign.  Musculoskeletal: Normal range of motion.  Lymphadenopathy:    He has no cervical adenopathy.  Neurological: He is alert and oriented to person, place, and time.  Skin: Skin is warm and dry.  Psychiatric: He has a normal mood and affect.  Nursing note and vitals reviewed.    ED Treatments / Results  Labs (all labs ordered are listed, but only abnormal results are displayed) Labs Reviewed  BASIC METABOLIC PANEL - Abnormal; Notable for the following:       Result Value   Glucose, Bld 102 (*)    All other components within normal limits  CBC  I-STAT TROPONIN, ED    EKG  EKG Interpretation  Date/Time:  Tuesday June 28 2017 13:29:17 EDT Ventricular Rate:  69 PR Interval:  210 QRS Duration: 100 QT Interval:  388 QTC Calculation: 415 R Axis:   89 Text Interpretation:  Sinus rhythm with 1st degree A-V block Abnormal ECG eKG similar to August 30, 2016 Confirmed by Ranae Palms  MD, DAVID ([id number]) on 06/28/2017 1:33:18 PM Also confirmed by Ranae Palms  MD, DAVID ([id number]), editor Barbette Hair [phone])  on 06/28/2017 2:16:50 PM       Radiology Dg Chest 2 View  Result Date: 06/28/2017 CLINICAL DATA:  36 year old male complaining of left-sided chest pain, nausea and shortness of breath for the past 3 days. EXAM: CHEST  2 VIEW COMPARISON:  Chest x-ray 08/30/2016. FINDINGS: Lung volumes are normal. No consolidative airspace disease. No pleural effusions. No pneumothorax. No pulmonary nodule or mass noted. Pulmonary vasculature and the cardiomediastinal silhouette are within normal limits. IMPRESSION: No radiographic evidence of acute cardiopulmonary disease. Electronically Signed   By: Trudie Reed M.D.   On: 06/28/2017 13:58    Procedures Procedures (including critical care time)  Medications Ordered in ED Medications - No data to display   Initial Impression /  Assessment and Plan / ED Course  I have reviewed the triage vital signs and the nursing notes.  Pertinent labs & imaging results that were available during my care of the patient were reviewed by me and considered in my medical decision making (see chart for details).     Patient presenting with intermittent chest pain which lasts for several seconds before resolving spontaneously. This is brought on by eating, especially greasy foods. Patient has history of indigestion in which the symptoms have been relieved by medicine for indigestion. He does not currently have any pain. Physical exam reassuring, as patient is not tachycardic, and heart and lung exam benign. Initial troponin negative, BMP and CMP reassuring. Chest x-ray negative, and EKG unchanged from prior.  Discussed findings with patient. Patient states he is ready to leave, as he has to pick his kids up from school. Discussed option of repeat troponin, but patient states since everything is normal so far, he does not want to wait. Patient with a heart score of 0. No calf pain or swelling, doubt PE. Symptoms consistent  with GERD. Will start patient on Nexium, and instructions to follow-up with primary care. At this time, patient appears safe for discharge. Strict return precautions given. Patient states he understands and agrees to plan.  Final Clinical Impressions(s) / ED Diagnoses   Final diagnoses:  Atypical chest pain    New Prescriptions Discharge Medication List as of 06/28/2017  5:16 PM    START taking these medications   Details  esomeprazole (NEXIUM) 40 MG capsule Take 1 capsule (40 mg total) by mouth daily., Starting Tue 06/28/2017, Print         Violaaccavale, Elleanor Guyett, PA-C 06/29/17 [id number]    Eber HongMiller, Brian, MD 06/29/17 [phone]

## 2017-06-28 NOTE — ED Triage Notes (Signed)
Pt states he started having CP for the past few days and some SOB. No N/v. Denies diaphoresis.

## 2017-10-01 ENCOUNTER — Encounter (HOSPITAL_COMMUNITY): Payer: Self-pay

## 2017-10-01 ENCOUNTER — Other Ambulatory Visit: Payer: Self-pay

## 2017-10-01 ENCOUNTER — Emergency Department (HOSPITAL_COMMUNITY)
Admission: EM | Admit: 2017-10-01 | Discharge: 2017-10-01 | Disposition: A | Discharge status: Home / Self Care | Payer: BLUE CROSS/BLUE SHIELD | Attending: Emergency Medicine | Admitting: Emergency Medicine

## 2017-10-01 DIAGNOSIS — Z79899 Other long term (current) drug therapy: Secondary | ICD-10-CM | POA: Insufficient documentation

## 2017-10-01 DIAGNOSIS — B349 Viral infection, unspecified: Secondary | ICD-10-CM | POA: Diagnosis not present

## 2017-10-01 DIAGNOSIS — R05 Cough: Secondary | ICD-10-CM | POA: Diagnosis present

## 2017-10-01 MED ORDER — FLUTICASONE PROPIONATE 50 MCG/ACT NA SUSP: 1.0000 | Freq: Every day | NASAL | 2 refills | Status: DC

## 2017-10-01 MED ORDER — BENZONATATE 100 MG PO CAPS: 100.0000 mg | ORAL_CAPSULE | Freq: Three times a day (TID) | ORAL | 0 refills | Status: DC | PRN

## 2017-10-01 NOTE — ED Notes (Signed)
Declined W/C at D/C and was escorted to lobby by RN. 

## 2017-10-01 NOTE — Discharge Instructions (Signed)
You were seen in the emergency today and diagnosed with a viral illness.  I have prescribed you multiple medications to treat your symptoms.   -Flonase to be used 1 spray in each nostril daily.  This medication is used to treat your congestion.  -Tessalon can be taken once every 8 hours as needed.  This medication is used to treat your cough.  You will need to follow-up with your primary care provider in 1 week if your symptoms have not improved.  If you do not have a primary care provider one is provided in your discharge instructions.  Return to the emergency department for any new or worsening symptoms including but not limited to persistent fever for 5 days, difficulty breathing, chest pain, or passing out.

## 2017-10-01 NOTE — ED Provider Notes (Signed)
[facility] Provider Note   CSN: [id number] Arrival date & time: 10/01/17  1451     History   Chief Complaint Chief Complaint  Patient presents with  . URI    HPI Evan Ibarra is a 36 y.o. male presents the emergency department complaining of congestion  that started yesterday.  Patient states that symptoms started with congestion and rhinorrhea as well as sneezing.  Has noticed infrequent coughing, nonproductive.  States he felt subjectively warm, he did not take a temperature.  Has tried NyQuil without significant relief.  No other alleviating or aggravating factors.  Denies eye discharge, eye pain, ear pain, sore throat, difficulty breathing, or chest pain.  HPI  Past Medical History:  Diagnosis Date  . GERD (gastroesophageal reflux disease)     There are no active problems to display for this patient.   Past Surgical History:  Procedure Laterality Date  . HERNIA REPAIR         Home Medications    Prior to Admission medications   Medication Sig Start Date End Date Taking? Authorizing Provider  amoxicillin (AMOXIL) 500 MG capsule Take 1 capsule (500 mg total) by mouth 3 (three) times daily. 01/19/17   Rancour, Jeannett SeniorStephen, MD  benzonatate (TESSALON) 100 MG capsule Take 1 capsule (100 mg total) by mouth 3 (three) times daily as needed for cough. 10/01/17   , Pleas KochSamantha R, PA-C  esomeprazole (NEXIUM) 40 MG capsule Take 1 capsule (40 mg total) by mouth daily. 06/28/17   Caccavale, Sophia, PA-C  fluticasone (FLONASE) 50 MCG/ACT nasal spray Place 1 spray into both nostrils daily. 10/01/17   , Pleas KochSamantha R, PA-C    Family History History reviewed. No pertinent family history.  Social History Social History   Tobacco Use  . Smoking status: Never Smoker  . Smokeless tobacco: Never Used  Substance Use Topics  . Alcohol use: No    Frequency: Never  . Drug use: No     Allergies   Patient has no known  allergies.   Review of Systems Review of Systems  Constitutional: Positive for fever (subjective). Negative for chills.  HENT: Positive for congestion and rhinorrhea. Negative for ear pain, sinus pressure, sinus pain and sore throat.   Eyes: Negative for pain, discharge, redness and itching.  Respiratory: Positive for cough. Negative for chest tightness and shortness of breath.   Cardiovascular: Negative for chest pain and palpitations.  Gastrointestinal: Negative for abdominal pain, diarrhea, nausea and vomiting.  Neurological: Negative for dizziness, weakness and numbness.     Physical Exam Updated Vital Signs BP (!) 134/92 (BP Location: Right Arm)   Pulse 84   Temp 99.1 F (37.3 C) (Oral)   Resp 16   Ht 6\' 2"  (1.88 m)   Wt 113.4 kg (250 lb)   SpO2 100%   BMI 32.10 kg/m   Physical Exam  Constitutional: He appears well-developed and well-nourished.  Non-toxic appearance. No distress.  HENT:  Head: Normocephalic and atraumatic.  Right Ear: Tympanic membrane is not perforated, not erythematous, not retracted and not bulging.  Left Ear: Tympanic membrane is not perforated, not erythematous, not retracted and not bulging.  Nose: Right sinus exhibits no maxillary sinus tenderness and no frontal sinus tenderness. Left sinus exhibits no maxillary sinus tenderness and no frontal sinus tenderness.  Mouth/Throat: Uvula is midline and oropharynx is clear and moist. No oropharyngeal exudate or posterior oropharyngeal erythema.  Nasal congestion present.   Eyes: Conjunctivae and EOM are normal. Pupils  are equal, round, and reactive to light. Right eye exhibits no discharge. Left eye exhibits no discharge.  Neck: Normal range of motion. Neck supple.  Cardiovascular: Normal rate and regular rhythm.  No murmur heard. Pulmonary/Chest: Breath sounds normal. No respiratory distress. He has no wheezes. He has no rales.  Abdominal: Soft. He exhibits no distension. There is no tenderness.   Lymphadenopathy:    He has no cervical adenopathy.  Neurological: He is alert.  Clear speech.   Skin: Skin is warm and dry. No rash noted.  Psychiatric: He has a normal mood and affect. His behavior is normal.  Nursing note and vitals reviewed.   ED Treatments / Results  Labs (all labs ordered are listed, but only abnormal results are displayed) Labs Reviewed - No data to display  EKG  EKG Interpretation None       Radiology No results found.  Procedures Procedures (including critical care time)  Medications Ordered in ED Medications - No data to display   Initial Impression / Assessment and Plan / ED Course  I have reviewed the triage vital signs and the nursing notes.  Pertinent labs & imaging results that were available during my care of the patient were reviewed by me and considered in my medical decision making (see chart for details).    Patient presents with symptoms consistent with viral illness. He is nontoxic appearing with stable vital signs. Patient is afebrile and without adventitious sounds on lung exam, doubt PNA. No wheezing on exam. Afebrile, no sinus tenderness, doubt sinusitis. No sore throat, Centor score 0, doubt strep pharyngitis. Suspect viral etiology, will treat supportively with Flonase, and Tessalon. I discussed results, treatment plan, need for PCP follow-up, and return precautions with the patient. Provided opportunity for questions, patient confirmed understanding and is in agreement with plan.    Final Clinical Impressions(s) / ED Diagnoses   Final diagnoses:  Viral illness    ED Discharge Orders        Ordered    fluticasone (FLONASE) 50 MCG/ACT nasal spray  Daily     10/01/17 1607    benzonatate (TESSALON) 100 MG capsule  3 times daily PRN     10/01/17 1607       , [place]antha R, PA-C 10/01/17 1652    Vanetta MuldersZackowski, Scott, MD 10/02/17 1636

## 2017-12-16 ENCOUNTER — Encounter (HOSPITAL_COMMUNITY): Payer: Self-pay | Admitting: *Deleted

## 2017-12-16 ENCOUNTER — Emergency Department (HOSPITAL_COMMUNITY)
Admission: EM | Admit: 2017-12-16 | Discharge: 2017-12-16 | Disposition: A | Discharge status: Home / Self Care | Payer: BLUE CROSS/BLUE SHIELD | Attending: Emergency Medicine | Admitting: Emergency Medicine

## 2017-12-16 ENCOUNTER — Other Ambulatory Visit: Payer: Self-pay

## 2017-12-16 DIAGNOSIS — Z79899 Other long term (current) drug therapy: Secondary | ICD-10-CM | POA: Diagnosis not present

## 2017-12-16 DIAGNOSIS — J3489 Other specified disorders of nose and nasal sinuses: Secondary | ICD-10-CM | POA: Diagnosis present

## 2017-12-16 DIAGNOSIS — J302 Other seasonal allergic rhinitis: Secondary | ICD-10-CM

## 2017-12-16 DIAGNOSIS — R067 Sneezing: Secondary | ICD-10-CM | POA: Diagnosis not present

## 2017-12-16 MED ORDER — CETIRIZINE HCL 10 MG PO TABS: 10.0000 mg | ORAL_TABLET | Freq: Every day | ORAL | 2 refills | Status: DC

## 2017-12-16 MED ORDER — FLUTICASONE PROPIONATE 50 MCG/ACT NA SUSP: 1.0000 | Freq: Every day | NASAL | 2 refills | Status: DC

## 2017-12-16 NOTE — ED Provider Notes (Signed)
[facility] Provider Note   CSN: [id number] Arrival date & time: 12/16/17  1209     History   Chief Complaint Chief Complaint  Patient presents with  . URI    HPI Evan Ibarra is a 37 y.o. male.  HPI   Evan Ibarra is a 37 year old male with a history of GERD and seasonal allergies who presents to the emergency department for evaluation of sneezing and runny nose. Patient states his symptoms began this morning. Believes it to be related to the recent warm weather yesterday.  States that he has had allergies in the past which improved with Zyrtec and Flonase, has not taken these medications for his current episode of sneezing and runny nose.  Denies fevers, chills, ear pain, eye pain, eye itching, sore throat, myalgias, cough, chest pain, shortness of breath.  Denies sick contacts with similar.  Past Medical History:  Diagnosis Date  . GERD (gastroesophageal reflux disease)     There are no active problems to display for this patient.   Past Surgical History:  Procedure Laterality Date  . HERNIA REPAIR         Home Medications    Prior to Admission medications   Medication Sig Start Date End Date Taking? Authorizing Provider  amoxicillin (AMOXIL) 500 MG capsule Take 1 capsule (500 mg total) by mouth 3 (three) times daily. 01/19/17   Rancour, Jeannett SeniorStephen, MD  benzonatate (TESSALON) 100 MG capsule Take 1 capsule (100 mg total) by mouth 3 (three) times daily as needed for cough. 10/01/17   Petrucelli, Pleas KochSamantha R, PA-C  esomeprazole (NEXIUM) 40 MG capsule Take 1 capsule (40 mg total) by mouth daily. 06/28/17   Caccavale, Sophia, PA-C  fluticasone (FLONASE) 50 MCG/ACT nasal spray Place 1 spray into both nostrils daily. 10/01/17   Petrucelli, Pleas KochSamantha R, PA-C    Family History History reviewed. No pertinent family history.  Social History Social History   Tobacco Use  . Smoking status: Never Smoker  . Smokeless tobacco: Never Used    Substance Use Topics  . Alcohol use: No    Frequency: Never  . Drug use: No     Allergies   Patient has no known allergies.   Review of Systems Review of Systems  Constitutional: Negative for chills and fever.  HENT: Positive for rhinorrhea and sneezing. Negative for ear pain, sinus pressure and sore throat.   Eyes: Negative for pain, itching and visual disturbance.  Respiratory: Negative for shortness of breath.   Cardiovascular: Negative for chest pain.  Gastrointestinal: Negative for abdominal pain, nausea and vomiting.  Skin: Negative for rash.  Neurological: Negative for headaches.     Physical Exam Updated Vital Signs BP (!) 131/96 (BP Location: Right Arm)   Pulse 76   Temp 98.5 F (36.9 C) (Oral)   Resp 18   SpO2 100%   Physical Exam  Constitutional: He is oriented to person, place, and time. He appears well-developed and well-nourished. No distress.  HENT:  Head: Normocephalic and atraumatic.  Nasal turbinates pale.  Clear rhinorrhea in bilateral nares.  No tenderness over the maxillary or frontal sinuses.  Mucous membranes moist.  Posterior oropharynx without erythema.  No tonsillar edema or exudate.  Uvula midline.  Eyes: Conjunctivae are normal. Pupils are equal, round, and reactive to light. Right eye exhibits no discharge. Left eye exhibits no discharge.  Neck: Normal range of motion. Neck supple.  Cardiovascular: Normal rate, regular rhythm and intact distal pulses. Exam reveals no  friction rub.  No murmur heard. Pulmonary/Chest: Effort normal and breath sounds normal. No stridor. No respiratory distress. He has no wheezes. He has no rales.  Lymphadenopathy:    He has no cervical adenopathy.  Neurological: He is alert and oriented to person, place, and time. Coordination normal.  Skin: Skin is warm and dry. He is not diaphoretic.  Psychiatric: He has a normal mood and affect. His behavior is normal.  Nursing note and vitals reviewed.    ED  Treatments / Results  Labs (all labs ordered are listed, but only abnormal results are displayed) Labs Reviewed - No data to display  EKG  EKG Interpretation None       Radiology No results found.  Procedures Procedures (including critical care time)  Medications Ordered in ED Medications - No data to display   Initial Impression / Assessment and Plan / ED Course  I have reviewed the triage vital signs and the nursing notes.  Pertinent labs & imaging results that were available during my care of the patient were reviewed by me and considered in my medical decision making (see chart for details).    Presentation consistent with allergic rhinitis.  Will treat with Zyrtec and Flonase.  Have counseled patient to follow-up with his primary care provider should his symptoms persist.  Patient agrees and voiced understanding of plan.  Final Clinical Impressions(s) / ED Diagnoses   Final diagnoses:  Seasonal allergic rhinitis, unspecified trigger    ED Discharge Orders        Ordered    cetirizine (ZYRTEC ALLERGY) 10 MG tablet  Daily     12/16/17 1423    fluticasone (FLONASE) 50 MCG/ACT nasal spray  Daily     12/16/17 1423       Lawrence Marseilles 12/16/17 1426    Donnetta Hutching, MD 12/17/17 [phone]

## 2017-12-16 NOTE — ED Triage Notes (Signed)
Pt reports nasal congestion since this am. No other complaints. No distress noted.

## 2017-12-16 NOTE — Discharge Instructions (Signed)
Please take Zyrtec and Flonase daily for your allergies.

## 2017-12-16 NOTE — ED Notes (Signed)
Declined W/C at D/C and was escorted to lobby by RN. 

## 2017-12-22 IMAGING — DX DG CHEST 2V
2 series · 2 of 2 positions shown · non-contrast
Comparison: 01/04/2008

CLINICAL DATA: Left-sided chest pain

EXAM:
CHEST  2 VIEW

[chest pa]
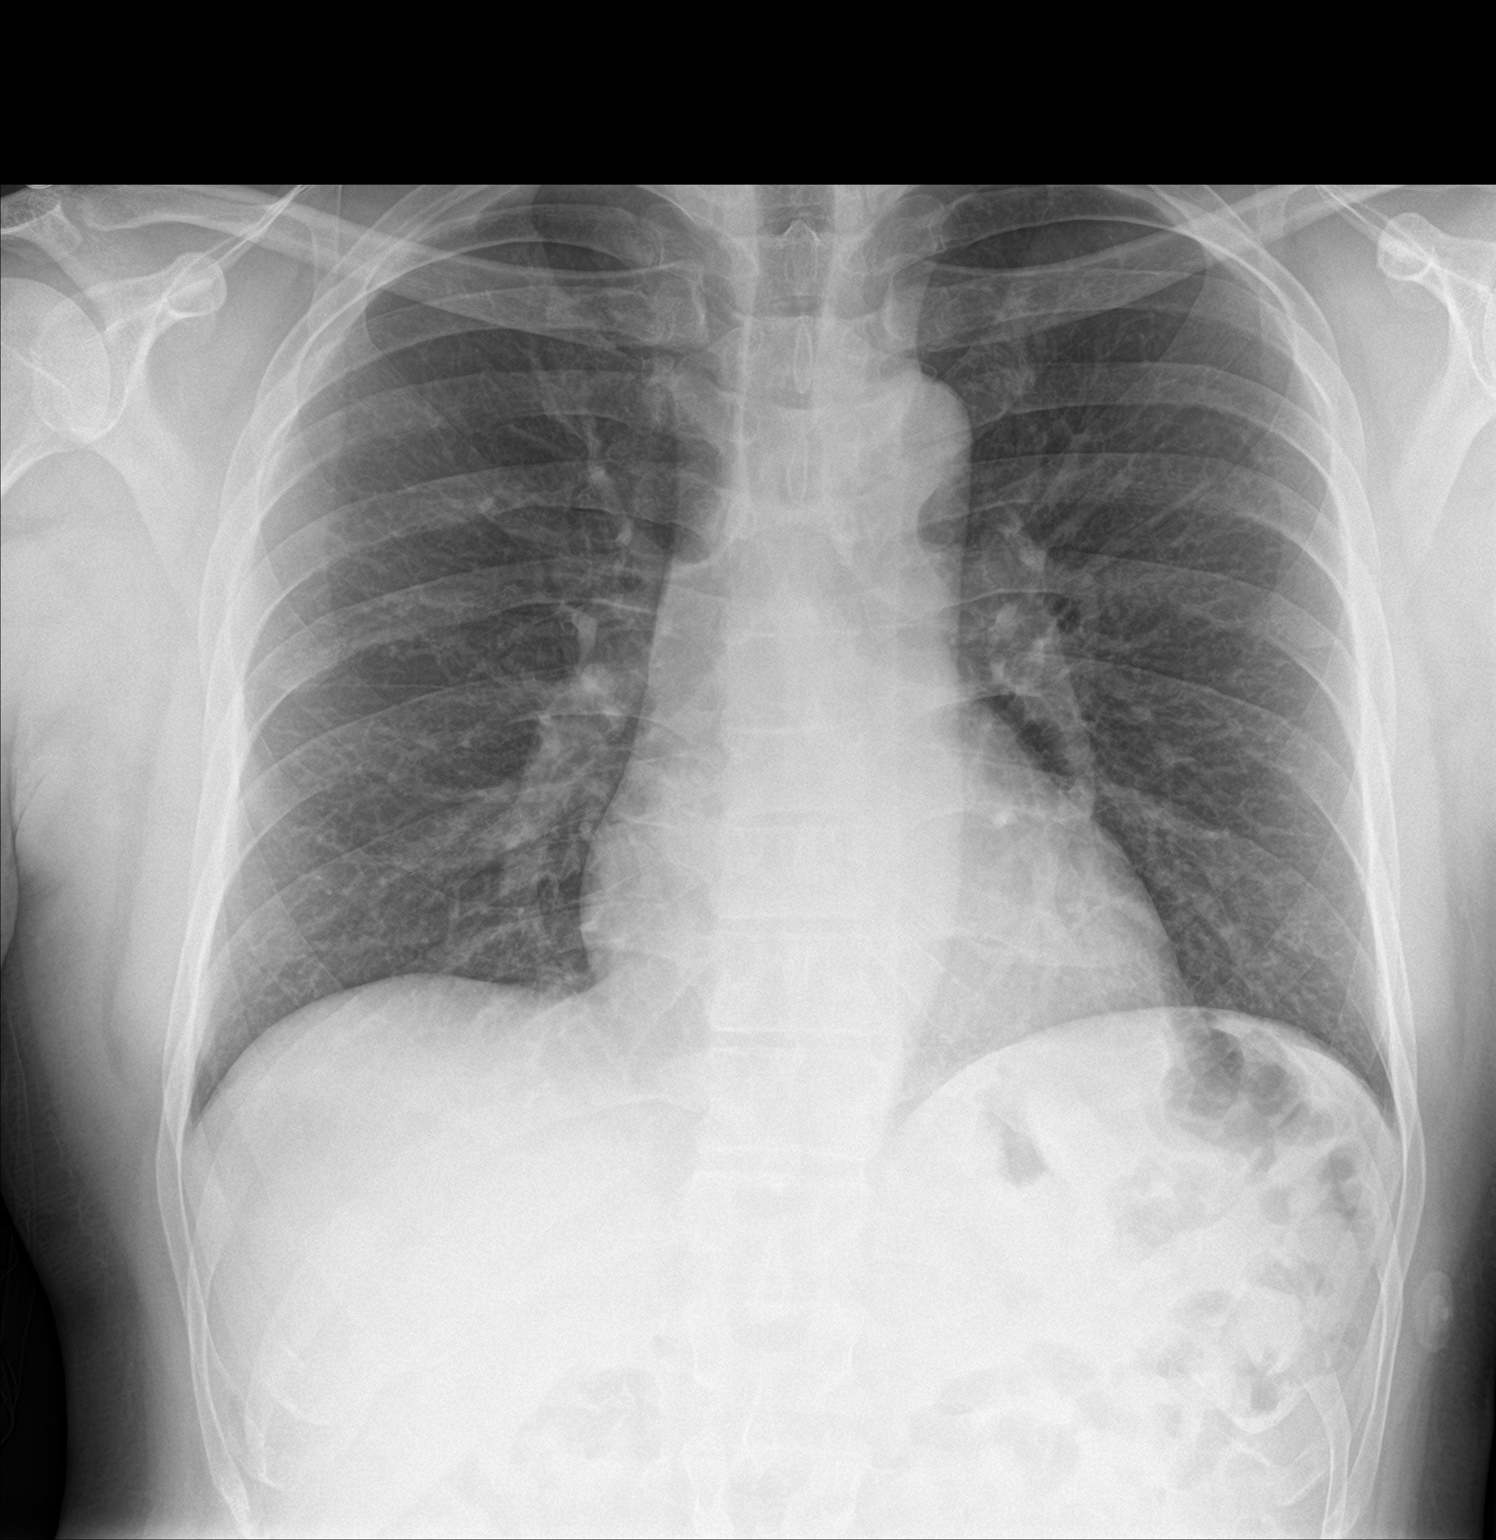

[chest lat]
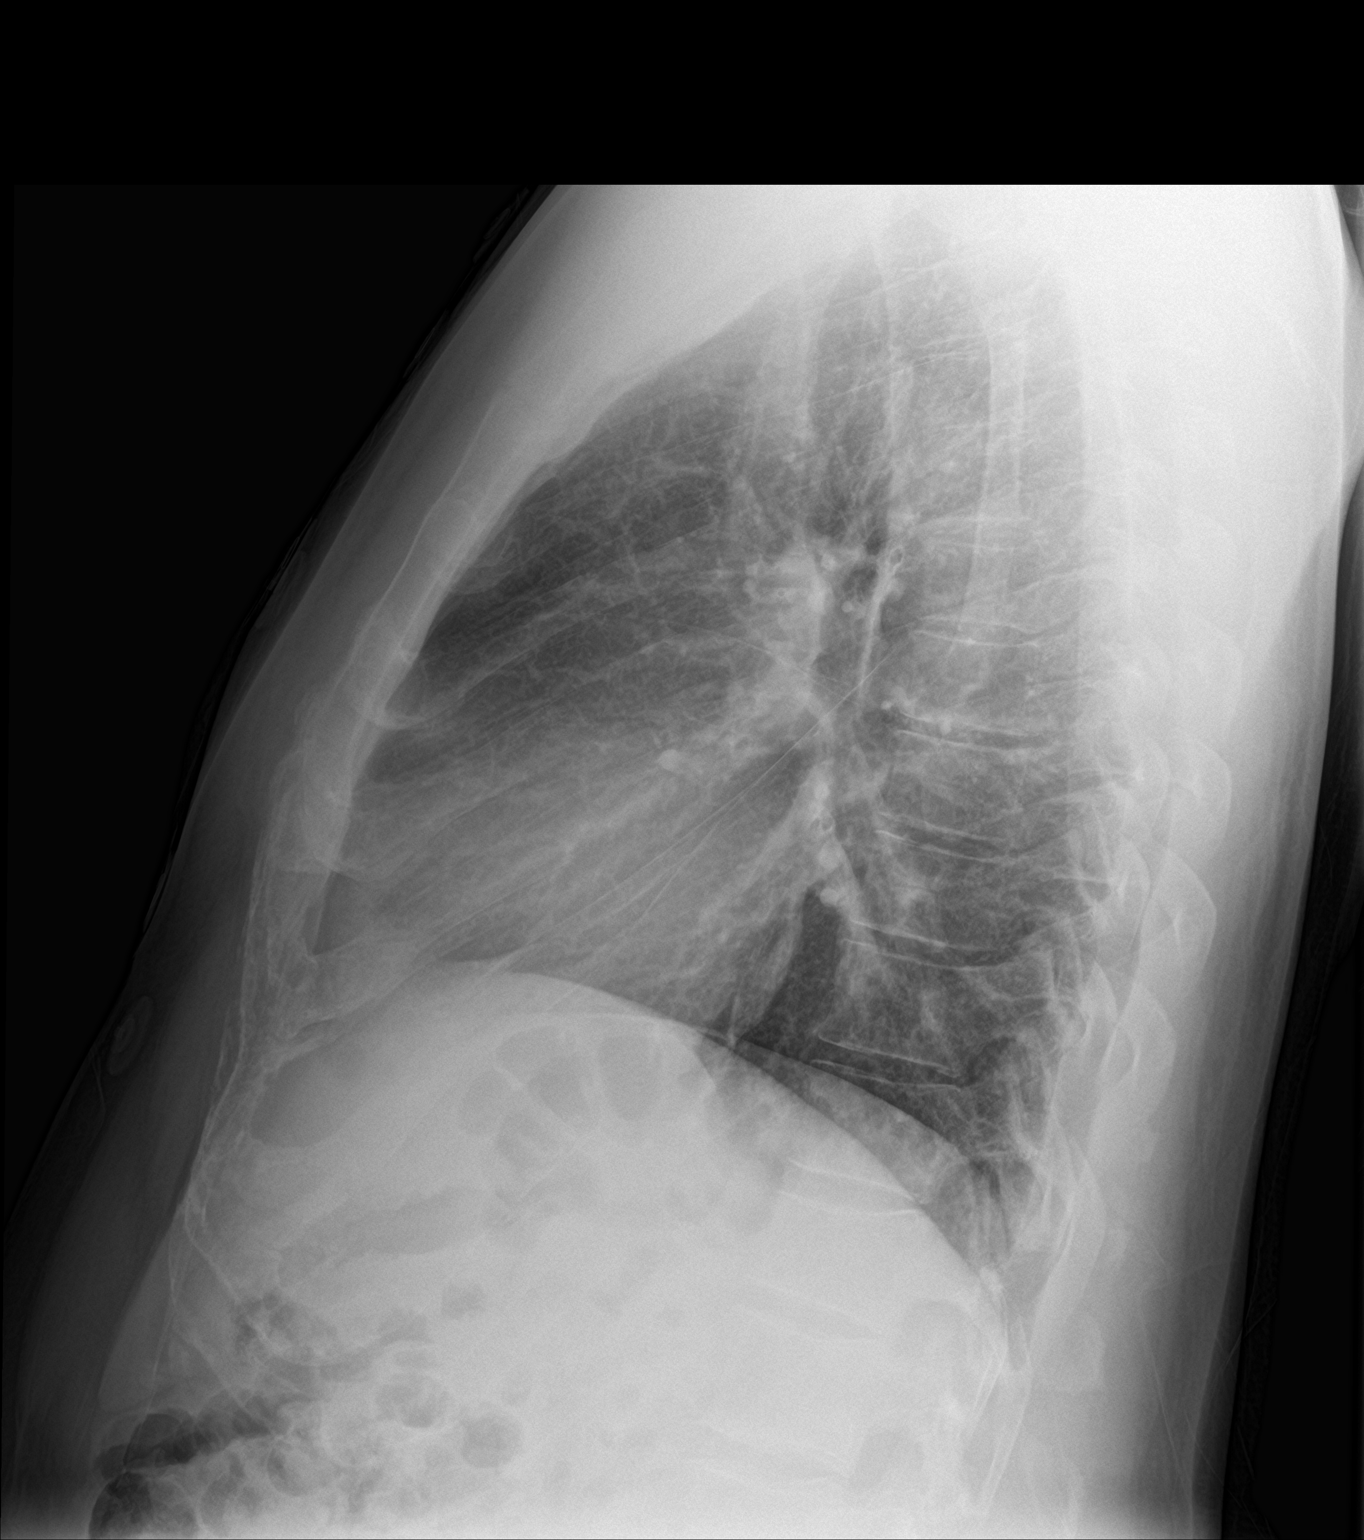

[2 of 2 positions shown; findings below may reference images not displayed]

FINDINGS: Lungs are clear.  No pleural effusion or pneumothorax.

Heart is normal in size.

Visualized osseous structures are within normal limits.
IMPRESSION: No evidence of acute cardiopulmonary disease.

## 2018-11-01 ENCOUNTER — Other Ambulatory Visit: Payer: Self-pay

## 2018-11-01 ENCOUNTER — Emergency Department (HOSPITAL_COMMUNITY)
Admission: EM | Admit: 2018-11-01 | Discharge: 2018-11-01 | Disposition: A | Discharge status: Home / Self Care | Payer: BLUE CROSS/BLUE SHIELD | Attending: Emergency Medicine | Admitting: Emergency Medicine

## 2018-11-01 ENCOUNTER — Encounter (HOSPITAL_COMMUNITY): Payer: Self-pay | Admitting: Emergency Medicine

## 2018-11-01 DIAGNOSIS — J069 Acute upper respiratory infection, unspecified: Secondary | ICD-10-CM | POA: Insufficient documentation

## 2018-11-01 MED ORDER — PSEUDOEPHEDRINE HCL 60 MG PO TABS: 60.0000 mg | ORAL_TABLET | Freq: Four times a day (QID) | ORAL | 0 refills | Status: DC | PRN

## 2018-11-01 NOTE — ED Notes (Signed)
ED Provider at bedside. 

## 2018-11-01 NOTE — ED Provider Notes (Signed)
[facility] Emergency Department Provider Note MRN:  [id number]  Arrival date & time: 11/01/18     Chief Complaint   URI   History of Present Illness   Evan Ibarra is a 38 y.o. year-old male with a history of GERD presenting to the ED with chief complaint of URI.  Patient explains that he was taking care of his daughter yesterday, who is having a runny nose and cough.  Thinks that she coughed time.  Late last night and today has been exhibiting nasal congestion and runny nose.  Denies cough, no fever, no body aches, no chest pain, no shortness of breath.  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  Symptoms are constant, no exacerbating relieving factors.  Review of Systems  A complete 10 system review of systems was obtained and all systems are negative except as noted in the HPI and PMH.   Patient's Health History    Past Medical History:  Diagnosis Date  . GERD (gastroesophageal reflux disease)     Past Surgical History:  Procedure Laterality Date  . HERNIA REPAIR      No family history on file.  Social History   Socioeconomic History  . Marital status: Married    Spouse name: Not on file  . Number of children: Not on file  . Years of education: Not on file  . Highest education level: Not on file  Occupational History  . Not on file  Social Needs  . Financial resource strain: Not on file  . Food insecurity:    Worry: Not on file    Inability: Not on file  . Transportation needs:    Medical: Not on file    Non-medical: Not on file  Tobacco Use  . Smoking status: Never Smoker  . Smokeless tobacco: Never Used  Substance and Sexual Activity  . Alcohol use: No    Frequency: Never  . Drug use: No  . Sexual activity: Not on file  Lifestyle  . Physical activity:    Days per week: Not on file    Minutes per session: Not on file  . Stress: Not on file  Relationships  . Social connections:    Talks on phone: Not  on file    Gets together: Not on file    Attends religious service: Not on file    Active member of club or organization: Not on file    Attends meetings of clubs or organizations: Not on file    Relationship status: Not on file  . Intimate partner violence:    Fear of current or ex partner: Not on file    Emotionally abused: Not on file    Physically abused: Not on file    Forced sexual activity: Not on file  Other Topics Concern  . Not on file  Social History Narrative  . Not on file     Physical Exam  Vital Signs and Nursing Notes reviewed Vitals:   11/01/18 1207 11/01/18 1210  BP:  (!) 132/101  Pulse:  82  Resp:  16  Temp: 98.7 F (37.1 C)   SpO2:  95%    CONSTITUTIONAL: Well-appearing, NAD NEURO:  Alert and oriented x 3, no focal deficits EYES:  eyes equal and reactive ENT/NECK:  no LAD, no JVD CARDIO: Regular rate, well-perfused, normal S1 and S2 PULM:  CTAB no wheezing or rhonchi GI/GU:  normal bowel sounds, non-distended, non-tender MSK/SPINE:  No gross deformities, no edema SKIN:  no rash, atraumatic PSYCH:  Appropriate speech and behavior  Diagnostic and Interventional Summary    Labs Reviewed - No data to display  No orders to display    Medications - No data to display   Procedures Critical Care  ED Course and Medical Decision Making  I have reviewed the triage vital signs and the nursing notes.  Pertinent labs & imaging results that were available during my care of the patient were reviewed by me and considered in my medical decision making (see below for details).  Well-appearing and nontoxic 38 year old healthy male with normal vital signs here with URI.  No complicating features, no meningismus, lungs clear, no body aches, little to no concern for influenza.  Provided reassurance, prescription for Sudafed, advised fluids at home, work note.  After the discussed management above, the patient was determined to be safe for discharge.  The patient  was in agreement with this plan and all questions regarding their care were answered.  ED return precautions were discussed and the patient will return to the ED with any significant worsening of condition.  Elmer Sow. Pilar Plate, MD [facility] Emergency Medicine [facility] [email or web address]  Final Clinical Impressions(s) / ED Diagnoses     ICD-10-CM   1. Viral upper respiratory tract infection J06.9     ED Discharge Orders         Ordered    pseudoephedrine (SUDAFED) 60 MG tablet  Every 6 hours PRN     11/01/18 1213             Sabas Sous, MD 11/01/18 1217

## 2018-11-01 NOTE — Discharge Instructions (Addendum)
You were evaluated in the Emergency Department and after careful evaluation, we did not find any emergent condition requiring admission or further testing in the hospital.  Your symptoms today seem to be due to an upper respiratory infection caused by a virus.  Please drink plenty of water at home and you can use the prescription provided for nasal congestion.  Tylenol and ibuprofen would also be helpful for generalized discomfort.  Please return to the Emergency Department if you experience any worsening of your condition.  We encourage you to follow up with a primary care provider.  Thank you for allowing [place] to be a part of your care.

## 2018-11-01 NOTE — ED Triage Notes (Signed)
Pt c/o cold symptoms with runny nose denies fever.

## 2018-12-11 ENCOUNTER — Emergency Department (HOSPITAL_COMMUNITY)
Admission: EM | Admit: 2018-12-11 | Discharge: 2018-12-11 | Disposition: A | Discharge status: Home / Self Care | Payer: Self-pay | Attending: Emergency Medicine | Admitting: Emergency Medicine

## 2018-12-11 ENCOUNTER — Encounter (HOSPITAL_COMMUNITY): Payer: Self-pay | Admitting: *Deleted

## 2018-12-11 DIAGNOSIS — R059 Cough, unspecified: Secondary | ICD-10-CM

## 2018-12-11 DIAGNOSIS — R05 Cough: Secondary | ICD-10-CM | POA: Insufficient documentation

## 2018-12-11 NOTE — Discharge Instructions (Addendum)
Please read instructions below.  You can take tylenol or ibuprofen as needed for sore throat or fever.  Drink plenty of water.  Use saline nasal spray for congestion. Follow up with your primary care provider as needed.  Return to the ER for inability to swallow liquids, difficulty breathing, or new or worsening symptoms.  

## 2018-12-11 NOTE — ED Triage Notes (Signed)
Pt in c/o cough since this morning, denies nasal congestion or sore throat, denies fever, no distress noted

## 2018-12-11 NOTE — ED Provider Notes (Signed)
[facility] EMERGENCY DEPARTMENT Provider Note   CSN: [id number] Arrival date & time: 12/11/18  1201     History   Chief Complaint Chief Complaint  Patient presents with  . Cough    HPI Evan Ibarra is a 38 y.o. male presenting to the emergency department with gradual onset of dry cough that began today.  Patient states he has a mild cough that began at work.  Cough is dry and nonproductive.  No other associated symptoms, including no nasal congestion, rhinorrhea, sore throat, fever, ear pain.  No shortness of breath or difficulty swallowing.  States he thought about taking DayQuil for his symptoms, however decided to come to the ED instead.  Patient has had the influenza vaccine this season.  The history is provided by the patient.    Past Medical History:  Diagnosis Date  . GERD (gastroesophageal reflux disease)     There are no active problems to display for this patient.   Past Surgical History:  Procedure Laterality Date  . HERNIA REPAIR          Home Medications    Prior to Admission medications   Medication Sig Start Date End Date Taking? Authorizing Provider  amoxicillin (AMOXIL) 500 MG capsule Take 1 capsule (500 mg total) by mouth 3 (three) times daily. 01/19/17   Rancour, Jeannett Senior, MD  benzonatate (TESSALON) 100 MG capsule Take 1 capsule (100 mg total) by mouth 3 (three) times daily as needed for cough. 10/01/17   Petrucelli, Lelon Mast R, PA-C  cetirizine (ZYRTEC ALLERGY) 10 MG tablet Take 1 tablet (10 mg total) by mouth daily. 12/16/17   Kellie Shropshire, PA-C  esomeprazole (NEXIUM) 40 MG capsule Take 1 capsule (40 mg total) by mouth daily. 06/28/17   Caccavale, Sophia, PA-C  fluticasone (FLONASE) 50 MCG/ACT nasal spray Place 1 spray into both nostrils daily. 10/01/17   Petrucelli, Samantha R, PA-C  fluticasone (FLONASE) 50 MCG/ACT nasal spray Place 1 spray into both nostrils daily. 12/16/17   Kellie Shropshire, PA-C  pseudoephedrine (SUDAFED)  60 MG tablet Take 1 tablet (60 mg total) by mouth every 6 (six) hours as needed for congestion. 11/01/18   Sabas Sous, MD    Family History History reviewed. No pertinent family history.  Social History Social History   Tobacco Use  . Smoking status: Never Smoker  . Smokeless tobacco: Never Used  Substance Use Topics  . Alcohol use: No    Frequency: Never  . Drug use: No     Allergies   Patient has no known allergies.   Review of Systems Review of Systems  Constitutional: Negative for fever.  HENT: Negative for congestion, ear pain, sore throat, trouble swallowing and voice change.   Respiratory: Positive for cough. Negative for shortness of breath.      Physical Exam Updated Vital Signs BP 136/89 (BP Location: Right Arm)   Pulse 93   Temp 98.9 F (37.2 C) (Oral)   Resp 17   SpO2 98%   Physical Exam Vitals signs and nursing note reviewed.  Constitutional:      General: He is not in acute distress.    Appearance: He is well-developed. He is not ill-appearing.  HENT:     Head: Normocephalic and atraumatic.     Right Ear: Tympanic membrane and ear canal normal.     Left Ear: Tympanic membrane and ear canal normal.     Nose: Nose normal.     Mouth/Throat:  Mouth: Mucous membranes are moist.     Pharynx: Oropharynx is clear.     Comments: Tolerating secretions.  No trismus.  Uvula midline. Eyes:     Conjunctiva/sclera: Conjunctivae normal.  Neck:     Musculoskeletal: Normal range of motion and neck supple.  Cardiovascular:     Rate and Rhythm: Normal rate and regular rhythm.  Pulmonary:     Effort: Pulmonary effort is normal. No respiratory distress.     Breath sounds: Normal breath sounds.  Lymphadenopathy:     Cervical: No cervical adenopathy.  Neurological:     Mental Status: He is alert.  Psychiatric:        Mood and Affect: Mood normal.        Behavior: Behavior normal.      ED Treatments / Results  Labs (all labs ordered are listed,  but only abnormal results are displayed) Labs Reviewed - No data to display  EKG None  Radiology No results found.  Procedures Procedures (including critical care time)  Medications Ordered in ED Medications - No data to display   Initial Impression / Assessment and Plan / ED Course  I have reviewed the triage vital signs and the nursing notes.  Pertinent labs & imaging results that were available during my care of the patient were reviewed by me and considered in my medical decision making (see chart for details).     Patient with dry cough that began a few hours prior to arrival.  No other associated symptoms. Afebrile, tolerating secretions.  Lungs clear to auscultation bilaterally.  Suspect early viral URI.  Discussed that antibiotics are not indicated for viral infections. Pt will be discharged with symptomatic treatment.  Verbalizes understanding and is agreeable with plan. Pt is hemodynamically stable & in NAD prior to dc.  Discussed results, findings, treatment and follow up. Patient advised of return precautions. Patient verbalized understanding and agreed with plan.  Final Clinical Impressions(s) / ED Diagnoses   Final diagnoses:  Cough    ED Discharge Orders    None       , [place] N, PA-C 12/11/18 1306    Maia Plan, MD 12/12/18 [phone]

## 2020-01-30 ENCOUNTER — Encounter (HOSPITAL_COMMUNITY): Payer: Self-pay

## 2020-01-30 ENCOUNTER — Ambulatory Visit (HOSPITAL_COMMUNITY)
Admission: EM | Admit: 2020-01-30 | Discharge: 2020-01-30 | Disposition: A | Discharge status: Home / Self Care | Payer: BC Managed Care – PPO | Attending: Family Medicine | Admitting: Family Medicine

## 2020-01-30 ENCOUNTER — Other Ambulatory Visit: Payer: Self-pay

## 2020-01-30 DIAGNOSIS — M5440 Lumbago with sciatica, unspecified side: Secondary | ICD-10-CM

## 2020-01-30 MED ORDER — TRAMADOL HCL 50 MG PO TABS: 50.0000 mg | ORAL_TABLET | Freq: Four times a day (QID) | ORAL | 0 refills | Status: DC | PRN

## 2020-01-30 MED ORDER — MELOXICAM 15 MG PO TABS: 15.0000 mg | ORAL_TABLET | Freq: Every day | ORAL | 0 refills | Status: AC

## 2020-01-30 MED ORDER — KETOROLAC TROMETHAMINE 30 MG/ML IJ SOLN
INTRAMUSCULAR | Status: AC
  Filled 2020-01-30: qty 1

## 2020-01-30 MED ORDER — KETOROLAC TROMETHAMINE 30 MG/ML IJ SOLN
30.0000 mg | Freq: Once | INTRAMUSCULAR | Status: AC
  Administered 2020-01-30: 14:00:00 30 mg via INTRAMUSCULAR

## 2020-01-30 MED ORDER — METHOCARBAMOL 500 MG PO TABS: 500.0000 mg | ORAL_TABLET | Freq: Two times a day (BID) | ORAL | 0 refills | Status: DC
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
## 2020-01-30 NOTE — Discharge Instructions (Addendum)
Take the prescribed Meloxicam for inflammation once daily.Do not take ibuprofen or naproxen with this medication. You may take Tylenol with this medication.  Take the Tramadol as needed for your breakthrough pain.    Take the muscle relaxer Robaxin as needed for muscle spasm.  Follow up with an orthopedist if your pain is not improving.  Go to the emergency department if you have worsening pain or develop new symptoms such as difficulty with urination, weakness, numbness, loss of control of your bladder or bowels, fever, chills or other concerns.
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
## 2020-02-04 ENCOUNTER — Other Ambulatory Visit: Payer: Self-pay

## 2020-02-04 ENCOUNTER — Encounter: Payer: Self-pay | Admitting: Physician Assistant

## 2020-02-04 ENCOUNTER — Ambulatory Visit: Payer: BC Managed Care – PPO | Admitting: Physician Assistant

## 2020-02-04 ENCOUNTER — Ambulatory Visit: Payer: Self-pay

## 2020-02-04 DIAGNOSIS — M545 Low back pain, unspecified: Secondary | ICD-10-CM

## 2020-02-04 DIAGNOSIS — G8929 Other chronic pain: Secondary | ICD-10-CM

## 2020-02-04 MED ORDER — METHYLPREDNISOLONE 4 MG PO TABS: ORAL_TABLET | ORAL | 0 refills | Status: DC
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
## 2020-05-15 ENCOUNTER — Emergency Department (HOSPITAL_COMMUNITY)
Admission: EM | Admit: 2020-05-15 | Discharge: 2020-05-15 | Disposition: A | Discharge status: Home / Self Care | Payer: BC Managed Care – PPO | Attending: Emergency Medicine | Admitting: Emergency Medicine

## 2020-05-15 ENCOUNTER — Encounter (HOSPITAL_COMMUNITY): Payer: Self-pay

## 2020-05-15 ENCOUNTER — Ambulatory Visit (INDEPENDENT_AMBULATORY_CARE_PROVIDER_SITE_OTHER)
Admission: EM | Admit: 2020-05-15 | Discharge: 2020-05-15 | Disposition: A | Discharge status: Home / Self Care | Payer: BC Managed Care – PPO | Source: Home / Self Care

## 2020-05-15 ENCOUNTER — Encounter (HOSPITAL_COMMUNITY): Payer: Self-pay | Admitting: Emergency Medicine

## 2020-05-15 ENCOUNTER — Other Ambulatory Visit: Payer: Self-pay

## 2020-05-15 DIAGNOSIS — I10 Essential (primary) hypertension: Secondary | ICD-10-CM

## 2020-05-15 DIAGNOSIS — Z5321 Procedure and treatment not carried out due to patient leaving prior to being seen by health care provider: Secondary | ICD-10-CM | POA: Insufficient documentation

## 2020-05-15 MED ORDER — AMLODIPINE BESYLATE 5 MG PO TABS: 5.0000 mg | ORAL_TABLET | Freq: Every day | ORAL | 1 refills | Status: AC

## 2020-05-15 NOTE — ED Triage Notes (Signed)
Pt went to dentist this AM, unable to get procedure done due to elevated BP readings. No history of HTN. Pt states he is under a lot of stress

## 2020-05-15 NOTE — ED Notes (Signed)
Pt states that he is going to go to Urgent Care instead of waiting.

## 2020-05-15 NOTE — ED Triage Notes (Signed)
Pt states he went to the dentist and was told his bp was high. Reports he has pcp but has never been told he has htn before. bp in 160s and 170s. Pt denies any associated symptoms.

## 2020-05-15 NOTE — ED Provider Notes (Signed)
[facility]    CSN: [id number] Arrival date & time: 05/15/20  0900      History   Chief Complaint Chief Complaint  Patient presents with  . Hypertension    HPI Evan Ibarra is a 39 y.o. male.   Patient is a 39 year old male with no known medical history.  Evan Ibarra presents today with hypertension.  Reporting Evan Ibarra was at the oral surgeon this morning to get wisdom teeth removed and had 2 elevated blood pressure readings in the 160s over 120s.  No known history of hypertension.  Reports Evan Ibarra is under a a lot of stress at this time.  Denies any chest pain, shortness of breath, dizziness, weakness, blurred vision.  Evan Ibarra is also in a lot of pain due to the impacted teeth.  Has been taking ibuprofen, Tylenol and amoxicillin for this.  ROS per HPI      Past Medical History:  Diagnosis Date  . GERD (gastroesophageal reflux disease)     There are no problems to display for this patient.   Past Surgical History:  Procedure Laterality Date  . HERNIA REPAIR         Home Medications    Prior to Admission medications   Medication Sig Start Date End Date Taking? Authorizing Provider  amLODipine (NORVASC) 5 MG tablet Take 1 tablet (5 mg total) by mouth daily. 05/15/20   Dahlia Byes A, NP  amoxicillin (AMOXIL) 500 MG capsule Take 500 mg by mouth 3 (three) times daily. 05/12/20   [provider]    Family History Family History  Problem Relation Age of Onset  . Heart Problems Mother   . Healthy Father     Social History Social History   Tobacco Use  . Smoking status: Never Smoker  . Smokeless tobacco: Never Used  Substance Use Topics  . Alcohol use: No  . Drug use: No     Allergies   Patient has no known allergies.   Review of Systems Review of Systems   Physical Exam Triage Vital Signs ED Triage Vitals  Enc Vitals Group     BP 05/15/20 0944 (!) 160/119     Pulse Rate 05/15/20 0944 82     Resp 05/15/20 0944 16     Temp 05/15/20 0944 97.8 F  (36.6 C)     Temp src --      SpO2 05/15/20 0944 99 %     Weight --      Height --      Head Circumference --      Peak Flow --      Pain Score 05/15/20 1025 0     Pain Loc --      Pain Edu? --      Excl. in GC? --    No data found.  Updated Vital Signs BP (!) 160/119   Pulse 82   Temp 97.8 F (36.6 C)   Resp 16   SpO2 99%   Visual Acuity Right Eye Distance:   Left Eye Distance:   Bilateral Distance:    Right Eye Near:   Left Eye Near:    Bilateral Near:     Physical Exam Vitals and nursing note reviewed.  Constitutional:      Appearance: Normal appearance.  HENT:     Head: Normocephalic and atraumatic.     Nose: Nose normal.  Eyes:     Conjunctiva/sclera: Conjunctivae normal.  Pulmonary:     Effort: Pulmonary effort is normal.  Musculoskeletal:        General: Normal range of motion.     Cervical back: Normal range of motion.  Skin:    General: Skin is warm and dry.  Neurological:     Mental Status: Evan Ibarra is alert.  Psychiatric:        Mood and Affect: Mood normal.      UC Treatments / Results  Labs (all labs ordered are listed, but only abnormal results are displayed) Labs Reviewed - No data to display  EKG   Radiology No results found.  Procedures Procedures (including critical care time)  Medications Ordered in UC Medications - No data to display  Initial Impression / Assessment and Plan / UC Course  I have reviewed the triage vital signs and the nursing notes.  Pertinent labs & imaging results that were available during my care of the patient were reviewed by me and considered in my medical decision making (see chart for details).     Essential hypertension Patient with 3 readings here of 160/120, 160/119 and 158/118 Historically patient has had elevated blood pressures over the past 3 to 4 months. Evan Ibarra is currently asymptomatic with this.  Evan Ibarra is also under a lot of stress and and pain from his dental problems. Feel it would be  appropriate to going to start him on amlodipine daily for blood pressure control. Talked about diet, weight loss and exercise Monitor blood pressures and keep a daily log of blood pressures Patient has a primary care provider.  Recommended follow-up with him in 4 to 6 weeks for recheck Reporting last saw him a few months ago and had normal blood work. Follow up as needed for continued or worsening symptoms  Final Clinical Impressions(s) / UC Diagnoses   Final diagnoses:  Essential hypertension     Discharge Instructions     Starting you on amlodipine for blood pressure control. Make sure that you decrease sodium Continue to exercise this can help with stress and decreased blood pressure Eat a healthy well-balanced diet Purchase a blood pressure cuff and keep monitoring your blood pressures daily Follow-up with your primary care for recheck in approximately 4 to 6 weeks    ED Prescriptions    Medication Sig Dispense Auth. Provider   amLODipine (NORVASC) 5 MG tablet Take 1 tablet (5 mg total) by mouth daily. 30 tablet Dahlia Byes A, NP     PDMP not reviewed this encounter.   Janace Aris, NP 05/15/20 1432

## 2020-05-15 NOTE — Discharge Instructions (Addendum)
Starting you on amlodipine for blood pressure control. Make sure that you decrease sodium Continue to exercise this can help with stress and decreased blood pressure Eat a healthy well-balanced diet Purchase a blood pressure cuff and keep monitoring your blood pressures daily Follow-up with your primary care for recheck in approximately 4 to 6 weeks

## 2021-09-24 ENCOUNTER — Encounter (HOSPITAL_BASED_OUTPATIENT_CLINIC_OR_DEPARTMENT_OTHER): Payer: Self-pay

## 2021-09-24 ENCOUNTER — Other Ambulatory Visit: Payer: Self-pay

## 2021-09-24 DIAGNOSIS — Z20822 Contact with and (suspected) exposure to covid-19: Secondary | ICD-10-CM | POA: Insufficient documentation

## 2021-09-24 DIAGNOSIS — R059 Cough, unspecified: Secondary | ICD-10-CM | POA: Diagnosis present

## 2021-09-24 DIAGNOSIS — J101 Influenza due to other identified influenza virus with other respiratory manifestations: Secondary | ICD-10-CM | POA: Insufficient documentation

## 2021-09-24 NOTE — ED Triage Notes (Signed)
Productive cough since Thanksgiving.

## 2021-09-25 ENCOUNTER — Emergency Department (HOSPITAL_BASED_OUTPATIENT_CLINIC_OR_DEPARTMENT_OTHER): Payer: BC Managed Care – PPO | Admitting: Radiology

## 2021-09-25 ENCOUNTER — Emergency Department (HOSPITAL_BASED_OUTPATIENT_CLINIC_OR_DEPARTMENT_OTHER)
Admission: EM | Admit: 2021-09-25 | Discharge: 2021-09-25 | Disposition: A | Discharge status: Home / Self Care | Payer: BC Managed Care – PPO | Attending: Emergency Medicine | Admitting: Emergency Medicine

## 2021-09-25 DIAGNOSIS — J111 Influenza due to unidentified influenza virus with other respiratory manifestations: Secondary | ICD-10-CM

## 2021-09-25 LAB — RESP PANEL BY RT-PCR (FLU A&B, COVID) ARPGX2
Influenza A by PCR: POSITIVE — AB
Influenza B by PCR: NEGATIVE
SARS Coronavirus 2 by RT PCR: NEGATIVE

## 2021-09-25 MED ORDER — BENZONATATE 100 MG PO CAPS: 100.0000 mg | ORAL_CAPSULE | Freq: Three times a day (TID) | ORAL | 0 refills | Status: AC | PRN

## 2021-09-25 MED ORDER — BENZONATATE 100 MG PO CAPS
100.0000 mg | ORAL_CAPSULE | Freq: Once | ORAL | Status: AC
  Administered 2021-09-25: 100 mg via ORAL
  Filled 2021-09-25: qty 1

## 2021-09-25 MED ORDER — HYDROCODONE BIT-HOMATROP MBR 5-1.5 MG/5ML PO SOLN: 5.0000 mL | Freq: Four times a day (QID) | ORAL | 0 refills | Status: AC | PRN

## 2021-09-25 NOTE — ED Notes (Signed)
Discharge instructions including prescription dicussed with pt. Pt verbalized understanding with no questions at this time.

## 2021-09-25 NOTE — Discharge Instructions (Addendum)
The medicine you received in the emergency room was called Tessalon.  If this helps with your cough please fill the prescription and take it as directed.  If it did not help then try the other prescription provided.  Understanding that this other prescription can make you sleepy, off balance and can delay decision making so it is not advised to do anything dangerous such as driving or working on heavy machinery while on it.  Your symptoms should start improving in the next few days but the cough may linger for up to 4 weeks.  Return for any worsening symptoms.

## 2021-09-26 NOTE — ED Provider Notes (Signed)
[facility] EMERGENCY DEPT Provider Note   CSN: [id number] Arrival date & time: 09/24/21  2328     History Chief Complaint  Patient presents with   Cough    Evan Ibarra is a 40 y.o. male.  40 year old male whose had a cough on and off for the last week or so.  Has been.  Has general malaise.  Felt hot but no measured temperature.  No lower extremity swelling.  No chest pain.  Some myalgias but not significant.  No other associated symptoms.  Tried over-the-counter medications with moderate relief.  Just is not improving as fast as he thought it should   Cough     Past Medical History:  Diagnosis Date   GERD (gastroesophageal reflux disease)     There are no problems to display for this patient.   Past Surgical History:  Procedure Laterality Date   HERNIA REPAIR         Family History  Problem Relation Age of Onset   Heart Problems Mother    Healthy Father     Social History   Tobacco Use   Smoking status: Never   Smokeless tobacco: Never  Substance Use Topics   Alcohol use: Yes   Drug use: No    Home Medications Prior to Admission medications   Medication Sig Start Date End Date Taking? Authorizing Provider  benzonatate (TESSALON) 100 MG capsule Take 1 capsule (100 mg total) by mouth 3 (three) times daily as needed for cough. 09/25/21  Yes Jermiah Howton, Barbara Cower, MD  HYDROcodone bit-homatropine (HYCODAN) 5-1.5 MG/5ML syrup Take 5 mLs by mouth every 6 (six) hours as needed for cough. 09/25/21  Yes Meeyah Ovitt, Barbara Cower, MD  amLODipine (NORVASC) 5 MG tablet Take 1 tablet (5 mg total) by mouth daily. 05/15/20   Dahlia Byes A, NP  amoxicillin (AMOXIL) 500 MG capsule Take 500 mg by mouth 3 (three) times daily. 05/12/20   [provider]    Allergies    Patient has no known allergies.  Review of Systems   Review of Systems  Respiratory:  Positive for cough.   All other systems reviewed and are negative.  Physical Exam Updated Vital Signs BP (!)  157/110   Pulse 79   Temp 98.2 F (36.8 C)   Resp 18   Ht 6\' 3"  (1.905 m)   Wt 110 kg   SpO2 99%   BMI 30.31 kg/m   Physical Exam Vitals and nursing note reviewed.  Constitutional:      Appearance: He is well-developed.  HENT:     Head: Normocephalic and atraumatic.     Nose: Nose normal. No congestion or rhinorrhea.  Eyes:     Pupils: Pupils are equal, round, and reactive to light.  Cardiovascular:     Rate and Rhythm: Normal rate.  Pulmonary:     Effort: Pulmonary effort is normal. No respiratory distress.  Abdominal:     General: Abdomen is flat. There is no distension.  Musculoskeletal:        General: Normal range of motion.     Cervical back: Normal range of motion.  Skin:    General: Skin is warm and dry.  Neurological:     General: No focal deficit present.     Mental Status: He is alert.    ED Results / Procedures / Treatments   Labs (all labs ordered are listed, but only abnormal results are displayed) Labs Reviewed  RESP PANEL BY RT-PCR (FLU A&B, COVID) ARPGX2 -  Abnormal; Notable for the following components:      Result Value   Influenza A by PCR POSITIVE (*)    All other components within normal limits    EKG None  Radiology DG Chest 2 View  Result Date: 09/25/2021 CLINICAL DATA:  EVA EXAM: CHEST - 2 VIEW COMPARISON:  PA LAT 06/28/2017 FINDINGS: The heart size and mediastinal contours are within normal limits with mild chronic aortic tortuosity. Both lungs are clear. The visualized skeletal structures are unremarkable. IMPRESSION: No active cardiopulmonary disease or interval change. Electronically Signed   By: Almira Bar M.D.   On: 09/25/2021 01:57    Procedures Procedures   Medications Ordered in ED Medications  benzonatate (TESSALON) capsule 100 mg (100 mg Oral Given by Other 09/25/21 1438)    ED Course  I have reviewed the triage vital signs and the nursing notes.  Pertinent labs & imaging results that were available during my care  of the patient were reviewed by me and considered in my medical decision making (see chart for details).    MDM Rules/Calculators/A&P                         Ultimately diagnosed with uncomplicated influenza. Will attempt symptom control at home.   Final Clinical Impression(s) / ED Diagnoses Final diagnoses:  Influenza    Rx / DC Orders ED Discharge Orders          Ordered    benzonatate (TESSALON) 100 MG capsule  3 times daily PRN        09/25/21 0340    HYDROcodone bit-homatropine (HYCODAN) 5-1.5 MG/5ML syrup  Every 6 hours PRN        09/25/21 0340             Terrica Duecker, Barbara Cower, MD 09/27/21 0246

## 2022-03-29 ENCOUNTER — Other Ambulatory Visit: Payer: Self-pay

## 2022-03-29 ENCOUNTER — Emergency Department (HOSPITAL_BASED_OUTPATIENT_CLINIC_OR_DEPARTMENT_OTHER)
Admission: EM | Admit: 2022-03-29 | Discharge: 2022-03-29 | Disposition: A | Discharge status: Home / Self Care | Payer: BC Managed Care – PPO | Attending: Emergency Medicine | Admitting: Emergency Medicine

## 2022-03-29 ENCOUNTER — Emergency Department (HOSPITAL_BASED_OUTPATIENT_CLINIC_OR_DEPARTMENT_OTHER): Payer: BC Managed Care – PPO | Admitting: Radiology

## 2022-03-29 ENCOUNTER — Encounter (HOSPITAL_BASED_OUTPATIENT_CLINIC_OR_DEPARTMENT_OTHER): Payer: Self-pay

## 2022-03-29 DIAGNOSIS — R079 Chest pain, unspecified: Secondary | ICD-10-CM | POA: Diagnosis not present

## 2022-03-29 DIAGNOSIS — F419 Anxiety disorder, unspecified: Secondary | ICD-10-CM | POA: Insufficient documentation

## 2022-03-29 LAB — TROPONIN I (HIGH SENSITIVITY): Troponin I (High Sensitivity): 4 ng/L (ref <18)

## 2022-03-29 LAB — CBC
HCT: 43.3 % (ref 39.0–52.0)
Hemoglobin: 14.3 g/dL (ref 13.0–17.0)
MCH: 29.9 pg (ref 26.0–34.0)
MCHC: 33 g/dL (ref 30.0–36.0)
MCV: 90.6 fL (ref 80.0–100.0)
Platelets: 334 10*3/uL (ref 150–400)
RBC: 4.78 MIL/uL (ref 4.22–5.81)
RDW: 12.6 % (ref 11.5–15.5)
WBC: 8.7 10*3/uL (ref 4.0–10.5)
nRBC: 0 % (ref 0.0–0.2)

## 2022-03-29 LAB — BASIC METABOLIC PANEL
Anion gap: 11 (ref 5–15)
BUN: 14 mg/dL (ref 6–20)
CO2: 28 mmol/L (ref 22–32)
Calcium: 9.6 mg/dL (ref 8.9–10.3)
Chloride: 102 mmol/L (ref 98–111)
Creatinine, Ser: 1.07 mg/dL (ref 0.61–1.24)
GFR, Estimated: >60 mL/min (ref >60)
Glucose, Bld: 83 mg/dL (ref 70–99)
Potassium: 4.1 mmol/L (ref 3.5–5.1)
Sodium: 141 mmol/L (ref 135–145)

## 2022-03-29 NOTE — Discharge Instructions (Signed)
Your work-up today was very reassuring.  All of your labs were normal as well as your chest x-ray.  I suspect that you are suffering from panic attacks from the stress at work.  Please follow-up with your primary care doctor to discuss anxiety medications, and I strongly suggest you consider searching for another job if it is causing you this much daily stress.
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
## 2022-04-01 ENCOUNTER — Ambulatory Visit: Payer: BC Managed Care – PPO | Admitting: Family Medicine

## 2022-05-01 ENCOUNTER — Encounter (HOSPITAL_BASED_OUTPATIENT_CLINIC_OR_DEPARTMENT_OTHER): Payer: Self-pay

## 2022-05-01 ENCOUNTER — Emergency Department (HOSPITAL_BASED_OUTPATIENT_CLINIC_OR_DEPARTMENT_OTHER): Payer: BC Managed Care – PPO

## 2022-05-01 ENCOUNTER — Emergency Department (HOSPITAL_BASED_OUTPATIENT_CLINIC_OR_DEPARTMENT_OTHER)
Admission: EM | Admit: 2022-05-01 | Discharge: 2022-05-01 | Disposition: A | Discharge status: Home / Self Care | Payer: BC Managed Care – PPO | Attending: Emergency Medicine | Admitting: Emergency Medicine

## 2022-05-01 DIAGNOSIS — R0789 Other chest pain: Secondary | ICD-10-CM | POA: Insufficient documentation

## 2022-05-01 DIAGNOSIS — R079 Chest pain, unspecified: Secondary | ICD-10-CM | POA: Diagnosis present

## 2022-05-01 DIAGNOSIS — F419 Anxiety disorder, unspecified: Secondary | ICD-10-CM | POA: Diagnosis not present

## 2022-05-01 HISTORY — DX: Anxiety disorder, unspecified: F41.9

## 2022-05-01 LAB — COMPREHENSIVE METABOLIC PANEL
ALT: 11 U/L (ref 0–44)
AST: 15 U/L (ref 15–41)
Albumin: 4.6 g/dL (ref 3.5–5.0)
Alkaline Phosphatase: 47 U/L (ref 38–126)
Anion gap: 7 (ref 5–15)
BUN: 13 mg/dL (ref 6–20)
CO2: 27 mmol/L (ref 22–32)
Calcium: 9.5 mg/dL (ref 8.9–10.3)
Chloride: 105 mmol/L (ref 98–111)
Creatinine, Ser: 0.87 mg/dL (ref 0.61–1.24)
GFR, Estimated: >60 mL/min (ref >60)
Glucose, Bld: 87 mg/dL (ref 70–99)
Potassium: 3.9 mmol/L (ref 3.5–5.1)
Sodium: 139 mmol/L (ref 135–145)
Total Bilirubin: 0.5 mg/dL (ref 0.3–1.2)
Total Protein: 7.6 g/dL (ref 6.5–8.1)

## 2022-05-01 LAB — CBC
HCT: 43.1 % (ref 39.0–52.0)
Hemoglobin: 14.4 g/dL (ref 13.0–17.0)
MCH: 30.5 pg (ref 26.0–34.0)
MCHC: 33.4 g/dL (ref 30.0–36.0)
MCV: 91.3 fL (ref 80.0–100.0)
Platelets: 296 10*3/uL (ref 150–400)
RBC: 4.72 MIL/uL (ref 4.22–5.81)
RDW: 12.9 % (ref 11.5–15.5)
WBC: 8.1 10*3/uL (ref 4.0–10.5)
nRBC: 0 % (ref 0.0–0.2)

## 2022-05-01 LAB — LIPASE, BLOOD: Lipase: 30 U/L (ref 11–51)

## 2022-05-01 LAB — TROPONIN I (HIGH SENSITIVITY): Troponin I (High Sensitivity): 4 ng/L (ref <18)

## 2022-05-01 MED ORDER — HYDROXYZINE HCL 25 MG PO TABS: 25.0000 mg | ORAL_TABLET | Freq: Four times a day (QID) | ORAL | 0 refills | Status: DC

## 2022-05-01 MED ORDER — SERTRALINE HCL 25 MG PO TABS: 25.0000 mg | ORAL_TABLET | Freq: Every day | ORAL | 0 refills | Status: AC

## 2022-05-01 MED ORDER — LORAZEPAM 2 MG/ML IJ SOLN
1.0000 mg | Freq: Once | INTRAMUSCULAR | Status: AC
  Administered 2022-05-01: 1 mg via INTRAVENOUS
  Filled 2022-05-01: qty 1

## 2022-05-01 MED ORDER — HYDROXYZINE HCL 25 MG PO TABS: 25.0000 mg | ORAL_TABLET | Freq: Four times a day (QID) | ORAL | 0 refills | Status: AC

## 2022-05-01 MED ORDER — SERTRALINE HCL 25 MG PO TABS: 25.0000 mg | ORAL_TABLET | Freq: Every day | ORAL | 0 refills | Status: DC

## 2022-05-01 NOTE — ED Triage Notes (Signed)
He c/o a small focussed area of left upper, lateral chest "pressure". He states he was seen here recently for same and was dx with "panic attack". He states "I still have a lot of stress at work" he is ambulatory and in no distress. His skin is normal, warm and dry and he is breathing normally.

## 2022-05-01 NOTE — ED Notes (Signed)
Dc instructions reviewed with patient. Patient voiced understanding. Dc with belongings.  °

## 2022-05-01 NOTE — ED Provider Notes (Signed)
[facility] EMERGENCY DEPT Provider Note   CSN: [id number] Arrival date & time: 05/01/22  1315     History  Chief Complaint  Patient presents with   Chest Pain    Evan Ibarra is a 41 y.o. male.  Patient here with chest pain, possibly panic attack.  History of the same.  Seen here recently for the same.  Saw his primary care doctor and was started on Zoloft and Vistaril which she is due for refills.  He has been under a lot of stress at work.  He denies any shortness of breath.  No recent travel or surgery.  No abdominal pain.  No exertional symptoms.  Nothing makes it worse or better.  The history is provided by the patient.       Home Medications Prior to Admission medications   Medication Sig Start Date End Date Taking? Authorizing Provider  hydrOXYzine (ATARAX) 25 MG tablet Take 1 tablet (25 mg total) by mouth every 6 (six) hours. 05/01/22  Yes Dreama Kuna, DO  sertraline (ZOLOFT) 25 MG tablet Take 1 tablet (25 mg total) by mouth daily. 05/01/22 05/31/22 Yes Leroi Haque, DO  amLODipine (NORVASC) 5 MG tablet Take 1 tablet (5 mg total) by mouth daily. 05/15/20   Dahlia Byes A, NP  amoxicillin (AMOXIL) 500 MG capsule Take 500 mg by mouth 3 (three) times daily. 05/12/20   [provider]  benzonatate (TESSALON) 100 MG capsule Take 1 capsule (100 mg total) by mouth 3 (three) times daily as needed for cough. 09/25/21   Mesner, Barbara Cower, MD  HYDROcodone bit-homatropine (HYCODAN) 5-1.5 MG/5ML syrup Take 5 mLs by mouth every 6 (six) hours as needed for cough. 09/25/21   Mesner, Barbara Cower, MD      Allergies    Patient has no known allergies.    Review of Systems   Review of Systems  Physical Exam Updated Vital Signs BP (!) 139/101   Pulse 71   Temp 98.4 F (36.9 C) (Oral)   Resp 17   SpO2 99%  Physical Exam Vitals and nursing note reviewed.  Constitutional:      General: He is not in acute distress.    Appearance: He is well-developed.  HENT:     Head:  Normocephalic and atraumatic.  Eyes:     Conjunctiva/sclera: Conjunctivae normal.  Cardiovascular:     Rate and Rhythm: Normal rate and regular rhythm.     Heart sounds: No murmur heard. Pulmonary:     Effort: Pulmonary effort is normal. No respiratory distress.     Breath sounds: Normal breath sounds.  Abdominal:     Palpations: Abdomen is soft.     Tenderness: There is no abdominal tenderness.  Musculoskeletal:        General: No swelling.     Cervical back: Neck supple.  Skin:    General: Skin is warm and dry.     Capillary Refill: Capillary refill takes less than 2 seconds.  Neurological:     General: No focal deficit present.     Mental Status: He is alert.  Psychiatric:        Mood and Affect: Mood is anxious.     ED Results / Procedures / Treatments   Labs (all labs ordered are listed, but only abnormal results are displayed) Labs Reviewed  CBC  COMPREHENSIVE METABOLIC PANEL  LIPASE, BLOOD  TROPONIN I (HIGH SENSITIVITY)    EKG EKG Interpretation  Date/Time:  Saturday May 01 2022 13:30:06 EDT Ventricular Rate:  71 PR Interval:  196 QRS Duration: 115 QT Interval:  395 QTC Calculation: 430 R Axis:   63 Text Interpretation: Sinus rhythm Confirmed by Virgina Norfolk ([id number]) on 05/01/2022 1:32:40 PM  Radiology DG Chest Portable 1 View  Result Date: 05/01/2022 CLINICAL DATA:  Chest pain. EXAM: PORTABLE CHEST 1 VIEW COMPARISON:  Chest x-ray dated March 29, 2022. FINDINGS: The heart size and mediastinal contours are within normal limits. Both lungs are clear. The visualized skeletal structures are unremarkable. IMPRESSION: No active disease. Electronically Signed   By: Obie Dredge M.D.   On: 05/01/2022 14:27    Procedures Procedures    Medications Ordered in ED Medications  LORazepam (ATIVAN) injection 1 mg (1 mg Intravenous Given 05/01/22 1350)    ED Course/ Medical Decision Making/ A&P                           Medical Decision Making Amount and/or  Complexity of Data Reviewed Labs: ordered. Radiology: ordered.  Risk Prescription drug management.   Evan Ibarra is here with chest pain.  History of anxiety.  Differential diagnosis likely panic attack but will rule out ACS, anemia, electrolyte abnormality.  We will give a dose of Ativan.  He is due for refill of his Vistaril and Zoloft.  He denies any fever or chills.  Still under a lot of stress at work.  Denies any suicidal homicidal ideation.  Have no concern for PE.  PERC negative.  Heart score 1.  Will check CBC, CMP, lipase, troponin, chest x-ray.  EKG per my review and interpretation shows sinus rhythm.  No ischemic changes.  Lab work per my review and interpretation shows no significant anemia, electrolyte abnormality, kidney injury, leukocytosis.  Troponin is normal.  Chest x-ray per my review interpretation shows no pneumonia or pneumothorax.  Overall suspect anxiety related process.  Will refill his Zoloft and Vistaril.  Discharged in good condition.  This chart was dictated using voice recognition software.  Despite best efforts to proofread,  errors can occur which can change the documentation meaning.         Final Clinical Impression(s) / ED Diagnoses Final diagnoses:  Chest pain, atypical    Rx / DC Orders ED Discharge Orders          Ordered    hydrOXYzine (ATARAX) 25 MG tablet  Every 6 hours        05/01/22 1343    sertraline (ZOLOFT) 25 MG tablet  Daily        05/01/22 1343              Enes Rokosz, Madelaine Bhat, DO 05/01/22 1449

## 2023-07-21 IMAGING — DX DG CHEST 2V
2 series · 2 of 2 positions shown · non-contrast
Comparison: None Available.

CLINICAL DATA: Chest pain

EXAM:
CHEST - 2 VIEW

[chest pa]
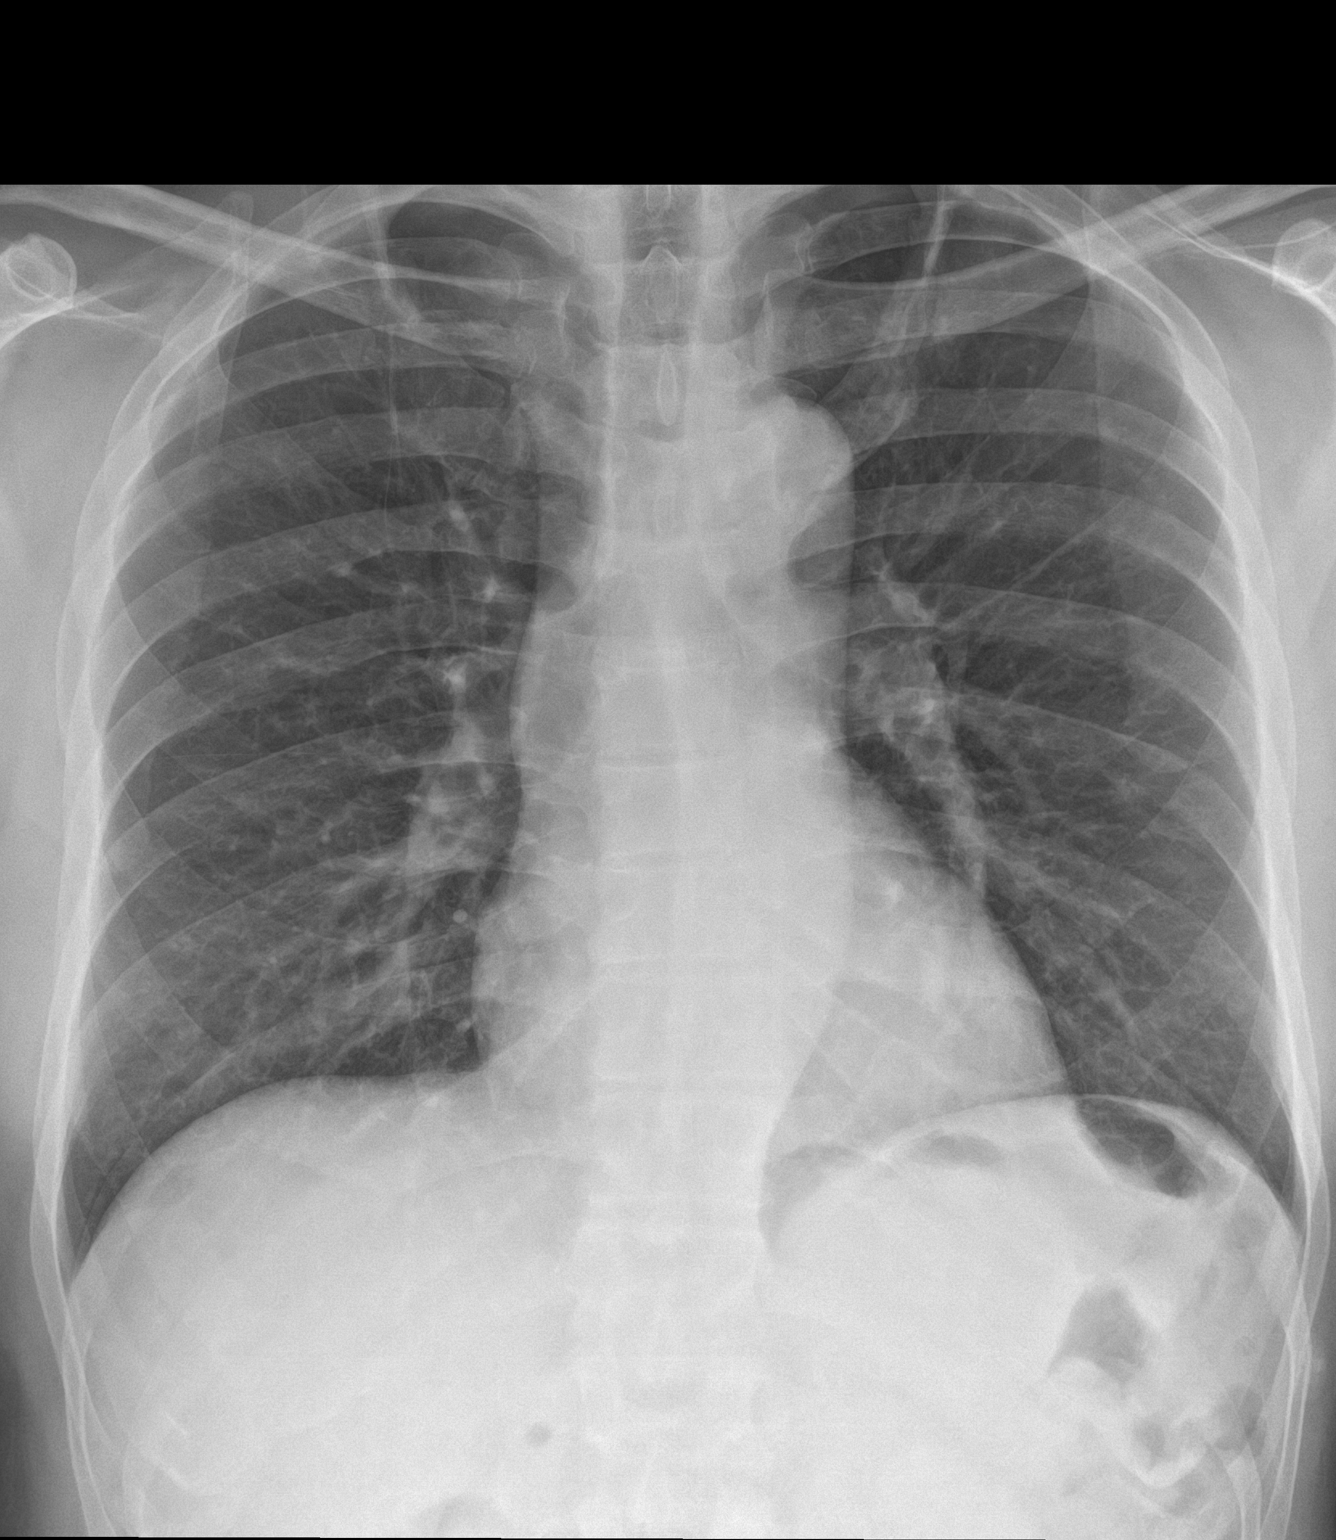

[chest lat]
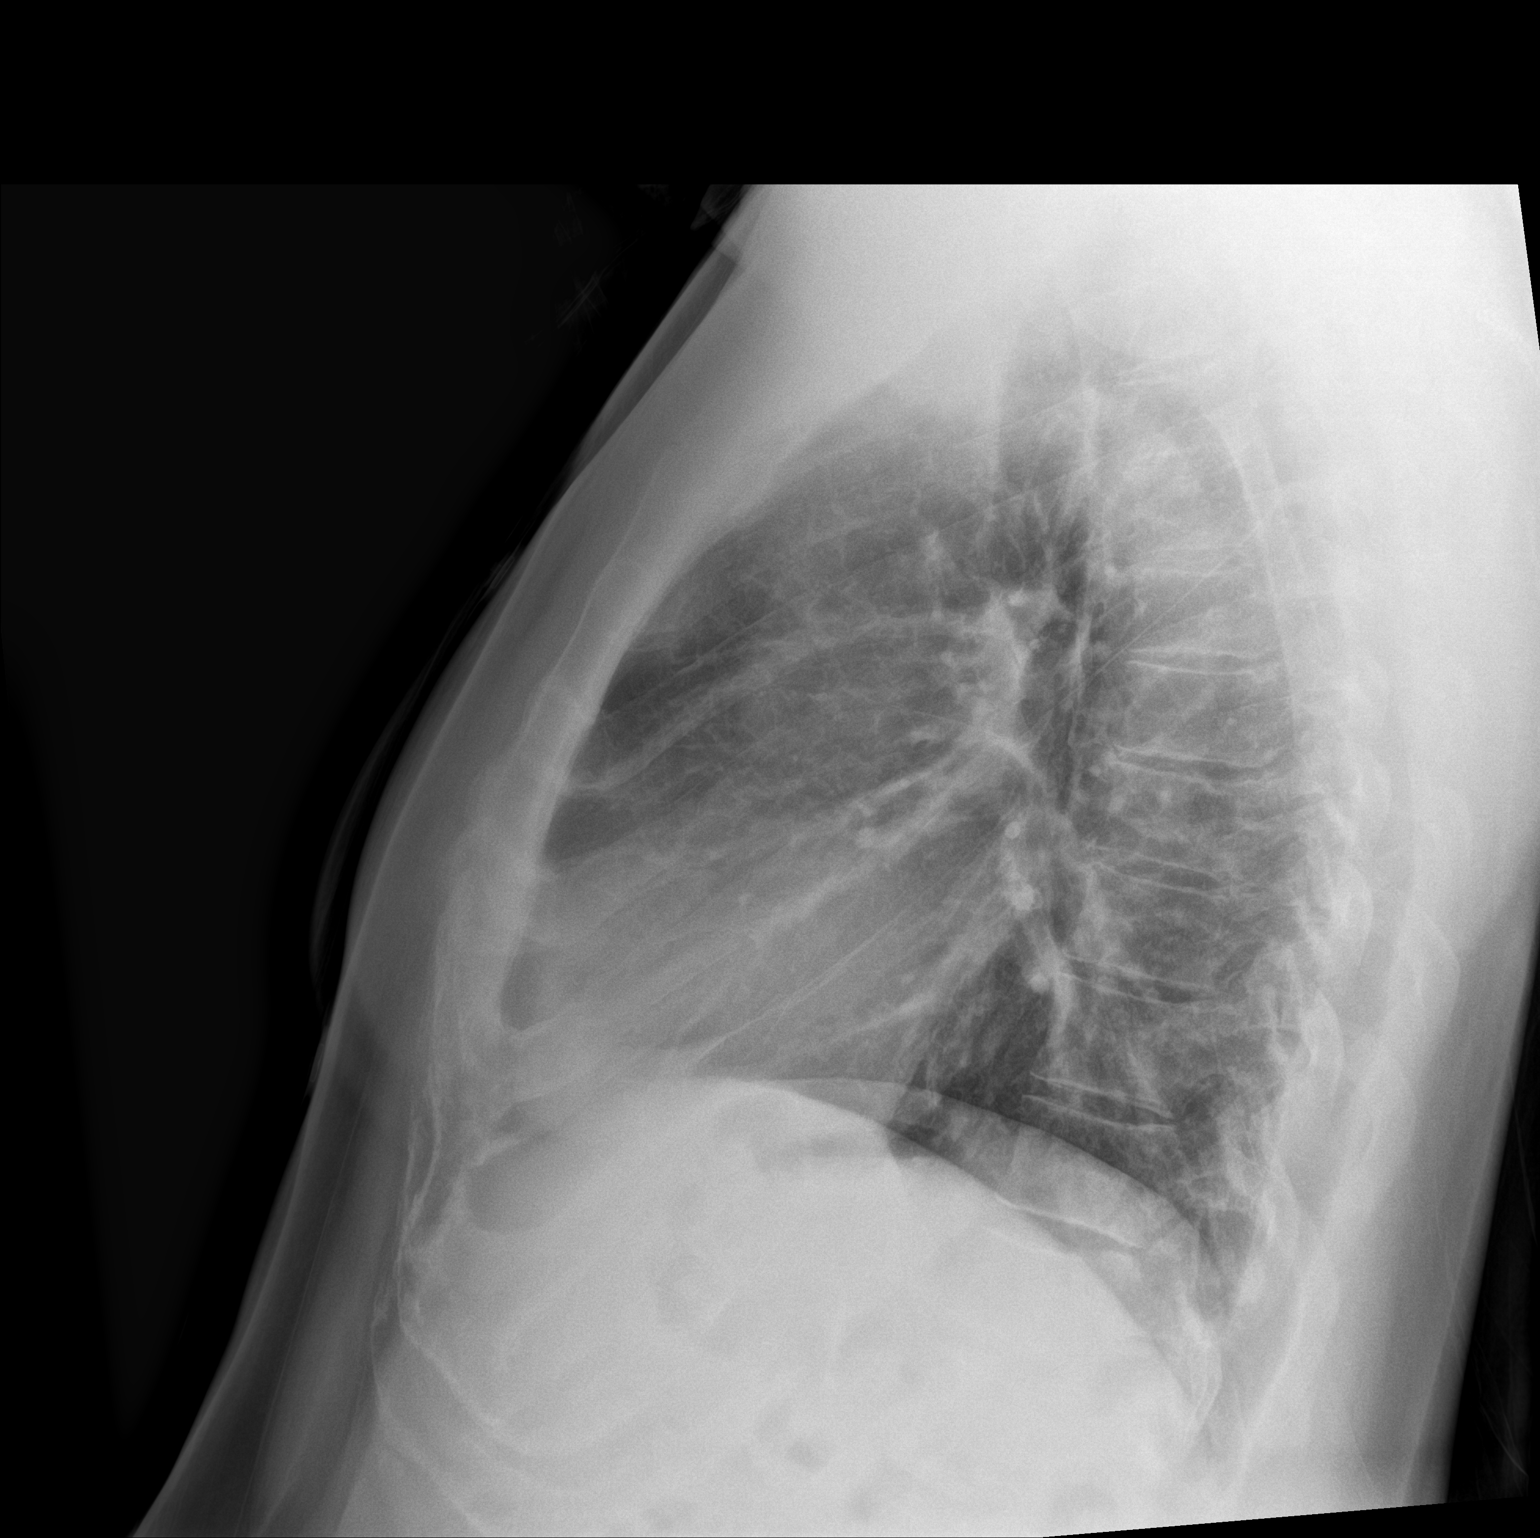

[2 of 2 positions shown; findings below may reference images not displayed]

FINDINGS: Normal mediastinum and cardiac silhouette. Normal pulmonary
vasculature. No evidence of effusion, infiltrate, or pneumothorax.
No acute bony abnormality.
IMPRESSION: No acute cardiopulmonary process.
